# Patient Record
Sex: Female | Born: 1975 | Race: White | Hispanic: No | Marital: Married | Smoking: Never smoker
Health system: Southern US, Community
[De-identification: ages and names within clinical notes are randomized; demographics above are authoritative.]

## PROBLEM LIST (undated history)

## (undated) DIAGNOSIS — M5126 Other intervertebral disc displacement, lumbar region: Secondary | ICD-10-CM

## (undated) DIAGNOSIS — R51 Headache: Secondary | ICD-10-CM

## (undated) DIAGNOSIS — K219 Gastro-esophageal reflux disease without esophagitis: Secondary | ICD-10-CM

## (undated) DIAGNOSIS — R519 Headache, unspecified: Secondary | ICD-10-CM

---

## 1997-10-01 ENCOUNTER — Other Ambulatory Visit: Admission: RE | Admit: 1997-10-01 | Discharge: 1997-10-01 | Payer: Self-pay | Admitting: Obstetrics and Gynecology

## 1998-03-27 ENCOUNTER — Inpatient Hospital Stay (HOSPITAL_COMMUNITY): Admission: AD | Admit: 1998-03-27 | Discharge: 1998-03-29 | Payer: Self-pay | Admitting: Obstetrics and Gynecology

## 1998-05-08 ENCOUNTER — Other Ambulatory Visit: Admission: RE | Admit: 1998-05-08 | Discharge: 1998-05-08 | Payer: Self-pay | Admitting: Obstetrics and Gynecology

## 1999-09-02 ENCOUNTER — Other Ambulatory Visit: Admission: RE | Admit: 1999-09-02 | Discharge: 1999-09-02 | Payer: Self-pay | Admitting: Obstetrics and Gynecology

## 2000-01-30 ENCOUNTER — Inpatient Hospital Stay (HOSPITAL_COMMUNITY): Admission: AD | Admit: 2000-01-30 | Discharge: 2000-01-30 | Payer: Self-pay | Admitting: Obstetrics and Gynecology

## 2000-02-08 ENCOUNTER — Inpatient Hospital Stay (HOSPITAL_COMMUNITY): Admission: AD | Admit: 2000-02-08 | Discharge: 2000-02-11 | Payer: Self-pay | Admitting: Obstetrics and Gynecology

## 2000-03-14 ENCOUNTER — Other Ambulatory Visit: Admission: RE | Admit: 2000-03-14 | Discharge: 2000-03-14 | Payer: Self-pay | Admitting: Obstetrics and Gynecology

## 2000-03-31 ENCOUNTER — Ambulatory Visit (HOSPITAL_COMMUNITY): Admission: RE | Admit: 2000-03-31 | Discharge: 2000-03-31 | Payer: Self-pay | Admitting: Obstetrics and Gynecology

## 2012-01-04 ENCOUNTER — Ambulatory Visit: Payer: 59

## 2013-03-12 ENCOUNTER — Ambulatory Visit (INDEPENDENT_AMBULATORY_CARE_PROVIDER_SITE_OTHER): Payer: 59 | Admitting: Physician Assistant

## 2013-03-12 VITALS — BP 102/80 | HR 116 | Temp 98.9°F | Resp 18 | Ht 65.0 in | Wt 164.0 lb

## 2013-03-12 DIAGNOSIS — R05 Cough: Secondary | ICD-10-CM

## 2013-03-12 MED ORDER — BENZONATATE 100 MG PO CAPS: ORAL_CAPSULE | ORAL | Status: AC

## 2013-03-12 MED ORDER — HYDROCOD POLST-CHLORPHEN POLST 10-8 MG/5ML PO LQCR: 5.0000 mL | Freq: Two times a day (BID) | ORAL | Status: AC

## 2013-03-12 NOTE — Progress Notes (Signed)
   [address] [place] [address]   Phone [phone]  Subjective:    Patient ID: Maria Odom, female    DOB: Jan 19, 1976, 37 y.o.   MRN: [id number]  HPI  Pt presents to clinic with cough for about 3 days - for about the last 5 days she has felt like she is getting sick but never got any cold symptoms except for the cough - the cough is dry and comes from her chest like a tickle - she has no h/o asthma and is not a smoker.  She coughs so hard her chest wall is hurting and she is throwing up.  She has not been able to sleep over the last couple of nights because of the cough.  Daughter is sick but she has the nasal congestion with the cough.  She has not used any OTC meds.  Review of Systems  Constitutional: Positive for chills. Negative for fever.  HENT: Negative for congestion, rhinorrhea and postnasal drip.   Respiratory: Positive for cough. Negative for shortness of breath and wheezing.   Cardiovascular: Positive for chest pain (chest wall pain).  Neurological: Negative for headaches.       Objective:   Physical Exam  Vitals reviewed. Constitutional: She is oriented to person, place, and time. She appears well-developed and well-nourished.  HENT:  Head: Normocephalic and atraumatic.  Right Ear: Hearing, tympanic membrane, external ear and ear canal normal.  Left Ear: Hearing, tympanic membrane, external ear and ear canal normal.  Nose: Nose normal.  Mouth/Throat: Uvula is midline, oropharynx is clear and moist and mucous membranes are normal.  Eyes: Conjunctivae are normal.  Neck: Normal range of motion.  Cardiovascular: Normal rate, regular rhythm and normal heart sounds.   No murmur heard. Pulmonary/Chest: Effort normal and breath sounds normal. She has no wheezes.  Lymphadenopathy:    She has no cervical adenopathy.  Neurological: She is alert and oriented to person, place, and time.  Skin: Skin is warm and dry.  Psychiatric: She has a normal mood and affect. Her  behavior is normal. Judgment and thought content normal.       Assessment & Plan:  Cough - from a viral illness - with dry cough it sounds inflammatory in nature. Plan: chlorpheniramine-HYDROcodone (TUSSIONEX PENNKINETIC ER) 10-8 MG/5ML LQCR, benzonatate (TESSALON) 100 MG capsule Pt to push fluids.  Benny Lennert PA-C 03/12/2013 9:44 AM

## 2014-02-13 ENCOUNTER — Ambulatory Visit: Payer: Self-pay | Admitting: Orthopedic Surgery

## 2014-02-21 ENCOUNTER — Ambulatory Visit: Payer: Self-pay | Admitting: Orthopedic Surgery

## 2014-02-26 ENCOUNTER — Encounter (HOSPITAL_COMMUNITY): Payer: Self-pay | Admitting: Pharmacy Technician

## 2014-03-05 ENCOUNTER — Ambulatory Visit: Payer: Self-pay | Admitting: Orthopedic Surgery

## 2014-03-05 NOTE — H&P (Signed)
Maria Odom DOB: Mar 02, 1976 Married / Language: English / Race: White Female  Chief complaint: back, buttock and leg pain  History of Present Illness  The patient is a 38 year old female who presents today for follow up of their back. The patient is being followed for their low back symptoms. They are now year(s) out from when symptoms began. Symptoms reported today include: pain (bilateral buttocks L > R), leg pain (bilaterally - comes and goes) and pain with sitting. The patient states that they are doing poorly. Current treatment includes: physical therapy, activity modification and NSAIDs. The following medication has been used for pain control: ibuprofen. The patient presents today following physical therapy. The patient has not gotten any relief of their symptoms with physical therapy (3 visits to date and it has made her worse). Note for "Follow-up back": Maria Odom follows up and reports worsening symptoms of back pain and B/L buttock and leg pain, left worse than right. Since her last visit with Dr. Shelle IronBeane she has been doing PT and reports this has actually made her worse. The buttock pain is constant, left worse than right. The leg pain is intermittent, lateral thigh to the knee. The buttock and leg pain is more severe than back pain. She did have a prior ESI with about a week of partial relief of her symptoms. She is worse sitting, better standing and moving, and is comfortable lying down. She works as an Environmental health practitioneradministrative assistant and has the option to sit/stand as needed which she has been doing.  Allergies  No Known Drug Allergies06/23/2015  Family History Diabetes Mellitus father First Degree Relatives  Social History Tobacco use never smoker  Medication History Ibuprofen (Oral) Specific dose unknown - Active. Medications Reconciled  Review of Systems  General Not Present- Fever and Weight Loss. Skin Not Present- Rash. Cardiovascular Not Present- Chest Pain and Shortness  of Breath. Musculoskeletal Present- Back Pain and Muscle Pain. Not Present- Joint Swelling. Neurological Present- Numbness. Not Present- Difficulty with balance, Loss of bladder control, Loss of bowel control and Weakness. Endocrine Not Present- Excessive Thirst and Excessive Urination. Hematology Not Present- Easy Bruising.  Physical Exam General Mental Status -Alert. General Appearance-pleasant, In acute distress, appears uncomfortable(standing, pacing). Orientation-Oriented X3. Build & Nutrition-Well nourished and Well developed. Gait-Unassisted and unimpaired.  Abdomen Palpation/Percussion Tenderness - Abdomen is non-tender to palpation. Rigidity (guarding) - Abdomen is soft.  Peripheral Vascular Lower Extremity Palpation - Homan's sign - Bilateral - Negative (normal). Posterior tibial pulse - Bilateral - 2+. Dorsalis pedis pulse - Bilateral - 2+.  Neurologic Motor Strength - Hip Flexion - Bilateral - 5/5. Quadriceps - Bilateral - 5/5. Hamstrings - Bilateral - 5/5. Ankle Dorsiflexion - Bilateral - 5/5. Ankle Plantarflexion - Bilateral - 5/5. Extensor Hallucis Longus - Bilateral - 5/5. Sensation Lower Extremity - Bilateral - sensation is intact to light touch in the lower extremity. Reflexes Patellar Reflex - Bilateral - 2+. Achilles Reflex - Bilateral - 2+. Babinski - Bilateral - Babinski not present. Clonus - Bilateral - clonus not present.  Musculoskeletal Spine/Ribs/Pelvis  Lumbosacral Spine: Inspection and Palpation - Tenderness - left buttock is tender to palpation and right buttock is tender to palpation, no tenderness to palpation of the lumbar spinous processes, no tenderness to palpation of the left flank, no tenderness to palpation of right flank, no tenderness to palpation of left lumbar paraspinals, no tenderness to palpation of right lumbar paraspinals, no tenderness to palpation of the left greater trochanter, no tenderness to palpation of the right  greater trochanter. Swelling - none. Other characteristics - no ecchymosis, no abnormal warmth, no erythema, no evidence of cellulitis. ROM - Flexion - moderately decreased range of motion and painful. Extension - mildly decreased range of motion. Special Testing - Lumbar - Left seated straight leg raise positive(produces buttock and thigh pain) and Right seated straight leg raise positive(produces buttock pain). Lower Extremity  Right Lower Extremity: Right Hip - ROM - Full ROM of the hip and pain-free. Right Knee - ROM - Full ROM of the knee and pain-free. Right Ankle - ROM - Full and pain-free ROM of the ankle. Left Lower Extremity: Left Hip - ROM - Full ROM of the hip and pain-free. Left Knee - ROM - Full ROM of the knee and pain-free. Left Ankle - ROM - Full and pain-free ROM of the ankle.  Lymphatic General Lymphatics Description - No Localized lymphadenopathy.  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 No listhesis or instability.  MRI images and report reviewed by Dr. 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 We also discussed increase in pain, adjacent segment disease, recurrent disc herniation, need for future surgery including repeat decompression and/or fusion. We also discussed risks of postoperative hematoma, paralysis, anesthetic complications including DVT, PE, death, cardiopulmonary dysfunction. In addition, the perioperative and postoperative courses were discussed in detail including the rehabilitative time and return to functional activity and work. I provided the patient with an illustrated handout and utilized the appropriate surgical models. Patient is instructed to follow up after surgery.  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 Discussed again relevant anatomy, HNP on MRI, ongoing symptoms, and tx options. She did have a prior ESI with only temporary relief. PT has actually worsened her symptoms, will hold on that for now. At this point, recommend proceeding with surgery which would be microlumbar decompression L4-5. Discussed the procedure itself as well as risks, complications, and alternatives, including but not limited to DVT, PE, infx, bleeding, failure of procedure, need for secondary procedure, nerve injury, ongoing pain/symptoms, dural tear, CSF leak, and anesthesia risk, even stroke or death. Also discussed typical post-op course as well as spine precautions: no lifting, bending, twisting, prolonged sitting x 6 weeks post-op. Discussed walking program, PT, gentle stretching post-op. All questions were answered. Patient desires to proceed with surgery. She is otherwise healthy and does not require any pre-op clearance. Discussed return to work with option to sit/stand several weeks post-op if doing well. We also discussed possible chance of future recurrent disc rupture or progressive DDD both of which could potentially require fusion if refractory to conservative tx. She denies any hx of MRSA or DVT. Will proceed accordingly to schedule and she will follow up 10-14 days post-op for suture removal. She will call with any questions or concerns in the interim.  Plan  microlumbar decompression L4-5   Signed electronically by Andrez Grime PA-C for Dr. Shelle Iron

## 2014-03-06 NOTE — Patient Instructions (Addendum)
Maria Odom  03/06/2014                           YOUR PROCEDURE IS SCHEDULED ON:  03/12/14               ENTER THRU [facility] MAIN [facility]OSPITAL ENTRANCE AND                            FOLLOW  SIGNS TO SHORT STAY CENTER                 ARRIVE AT SHORT STAY AT:  9:30 AM               CALL THIS NUMBER IF ANY PROBLEMS THE DAY OF SURGERY :               [phone]                                REMEMBER:   Do not eat food or drink liquids AFTER MIDNIGHT                  Take these medicines the morning of surgery with               A SIPS OF WATER :   PRILOSEC      Do not wear jewelry, make-up   Do not wear lotions, powders, or perfumes.   Do not shave legs or underarms 12 hrs. before surgery (men may shave face)  Do not bring valuables to the hospital.  Contacts, dentures or bridgework may not be worn into surgery.  Leave suitcase in the car. After surgery it may be brought to your room.  For patients admitted to the hospital more than one night, checkout time is            11:00 AM                                                       The day of discharge.   Patients discharged the day of surgery will not be allowed to drive home.            If going home same day of surgery, must have someone stay with you              FIRST 24 hrs at home and arrange for some one to drive you              home from hospital.   ________________________________________________________________________                                                                                                  [facility] - PREPARING FOR SURGERY  Before surgery, you can play an important role.  Because  skin is not sterile, your skin needs to be as free of germs as possible.  You can reduce the number of germs on your skin by washing with CHG (chlorahexidine gluconate) soap before surgery.  CHG is an antiseptic cleaner which kills germs and bonds with the skin to continue killing germs even  after washing. Please DO NOT use if you have an allergy to CHG or antibacterial soaps.  If your skin becomes reddened/irritated stop using the CHG and inform your nurse when you arrive at Short Stay. Do not shave (including legs and underarms) for at least 48 hours prior to the first CHG shower.  You may shave your face. Please follow these instructions carefully:   1.  Shower with CHG Soap the night before surgery and the  morning of Surgery.   2.  If you choose to wash your hair, wash your hair first as usual with your  normal  Shampoo.   3.  After you shampoo, rinse your hair and body thoroughly to remove the  shampoo.                                         4.  Use CHG as you would any other liquid soap.  You can apply chg directly  to the skin and wash . Gently wash with scrungie or clean wascloth    5.  Apply the CHG Soap to your body ONLY FROM THE NECK DOWN.   Do not use on open                           Wound or open sores. Avoid contact with eyes, ears mouth and genitals (private parts).                        Genitals (private parts) with your normal soap.              6.  Wash thoroughly, paying special attention to the area where your surgery  will be performed.   7.  Thoroughly rinse your body with warm water from the neck down.   8.  DO NOT shower/wash with your normal soap after using and rinsing off  the CHG Soap .                9.  Pat yourself dry with a clean towel.             10.  Wear clean pajamas.             11.  Place clean sheets on your bed the night of your first shower and do not  sleep with pets.  Day of Surgery : Do not apply any lotions/deodorants the morning of surgery.  Please wear clean clothes to the hospital/surgery center.  FAILURE TO FOLLOW THESE INSTRUCTIONS MAY RESULT IN THE CANCELLATION OF YOUR SURGERY    PATIENT  SIGNATURE_________________________________  ______________________________________________________________________     Maria Odom  An incentive spirometer is a tool that can help keep your lungs clear and active. This tool measures how well you are filling your lungs with each breath. Taking long deep breaths may help reverse or decrease the chance of developing breathing (pulmonary) problems (especially infection) following:  A long period of time when you are unable to move or be active. BEFORE THE  PROCEDURE   If the spirometer includes an indicator to show your best effort, your nurse or respiratory therapist will set it to a desired goal.  If possible, sit up straight or lean slightly forward. Try not to slouch.  Hold the incentive spirometer in an upright position. INSTRUCTIONS FOR USE  1. Sit on the edge of your bed if possible, or sit up as far as you can in bed or on a chair. 2. Hold the incentive spirometer in an upright position. 3. Breathe out normally. 4. Place the mouthpiece in your mouth and seal your lips tightly around it. 5. Breathe in slowly and as deeply as possible, raising the piston or the ball toward the top of the column. 6. Hold your breath for 3-5 seconds or for as long as possible. Allow the piston or ball to fall to the bottom of the column. 7. Remove the mouthpiece from your mouth and breathe out normally. 8. Rest for a few seconds and repeat Steps 1 through 7 at least 10 times every 1-2 hours when you are awake. Take your time and take a few normal breaths between deep breaths. 9. The spirometer may include an indicator to show your best effort. Use the indicator as a goal to work toward during each repetition. 10. After each set of 10 deep breaths, practice coughing to be sure your lungs are clear. If you have an incision (the cut made at the time of surgery), support your incision when coughing by placing a pillow or rolled up towels firmly  against it. Once you are able to get out of bed, walk around indoors and cough well. You may stop using the incentive spirometer when instructed by your caregiver.  RISKS AND COMPLICATIONS  Take your time so you do not get dizzy or light-headed.  If you are in pain, you may need to take or ask for pain medication before doing incentive spirometry. It is harder to take a deep breath if you are having pain. AFTER USE  Rest and breathe slowly and easily.  It can be helpful to keep track of a log of your progress. Your caregiver can provide you with a simple table to help with this. If you are using the spirometer at home, follow these instructions: SEEK MEDICAL CARE IF:   You are having difficultly using the spirometer.  You have trouble using the spirometer as often as instructed.  Your pain medication is not giving enough relief while using the spirometer.  You develop fever of 100.5 F (38.1 C) or higher. SEEK IMMEDIATE MEDICAL CARE IF:   You cough up bloody sputum that had not been present before.  You develop fever of 102 F (38.9 C) or greater.  You develop worsening pain at or near the incision site. MAKE SURE YOU:   Understand these instructions.  Will watch your condition.  Will get help right away if you are not doing well or get worse. Document Released: 10/03/2006 Document Revised: 08/15/2011 Document Reviewed: 12/04/2006 [facility] Patient Information 2014 [place], [place].   ________________________________________________________________________

## 2014-03-07 ENCOUNTER — Encounter (HOSPITAL_COMMUNITY)
Admission: RE | Admit: 2014-03-07 | Discharge: 2014-03-07 | Disposition: A | Discharge status: Home / Self Care | Payer: 59 | Source: Ambulatory Visit | Attending: Specialist | Admitting: Specialist

## 2014-03-07 ENCOUNTER — Encounter (INDEPENDENT_AMBULATORY_CARE_PROVIDER_SITE_OTHER): Payer: Self-pay

## 2014-03-07 ENCOUNTER — Encounter (HOSPITAL_COMMUNITY): Payer: Self-pay

## 2014-03-07 ENCOUNTER — Ambulatory Visit (HOSPITAL_COMMUNITY)
Admission: RE | Admit: 2014-03-07 | Discharge: 2014-03-07 | Disposition: A | Discharge status: Home / Self Care | Payer: 59 | Source: Ambulatory Visit | Attending: Orthopedic Surgery | Admitting: Orthopedic Surgery

## 2014-03-07 DIAGNOSIS — M519 Unspecified thoracic, thoracolumbar and lumbosacral intervertebral disc disorder: Secondary | ICD-10-CM | POA: Diagnosis not present

## 2014-03-07 DIAGNOSIS — Z01818 Encounter for other preprocedural examination: Secondary | ICD-10-CM | POA: Diagnosis present

## 2014-03-07 HISTORY — DX: Headache, unspecified: R51.9

## 2014-03-07 HISTORY — DX: Headache: R51

## 2014-03-07 HISTORY — DX: Other intervertebral disc displacement, lumbar region: M51.26

## 2014-03-07 HISTORY — DX: Gastro-esophageal reflux disease without esophagitis: K21.9

## 2014-03-07 LAB — CBC
HCT: 42.6 % (ref 36.0–46.0)
HEMOGLOBIN: 14.4 g/dL (ref 12.0–15.0)
MCH: 31.8 pg (ref 26.0–34.0)
MCHC: 33.8 g/dL (ref 30.0–36.0)
MCV: 94 fL (ref 78.0–100.0)
Platelets: 296 10*3/uL (ref 150–400)
RBC: 4.53 MIL/uL (ref 3.87–5.11)
RDW: 12.4 % (ref 11.5–15.5)
WBC: 6.3 10*3/uL (ref 4.0–10.5)

## 2014-03-07 LAB — BASIC METABOLIC PANEL
ANION GAP: 13 (ref 5–15)
BUN: 10 mg/dL (ref 6–23)
CHLORIDE: 100 meq/L (ref 96–112)
CO2: 24 mEq/L (ref 19–32)
CREATININE: 0.79 mg/dL (ref 0.50–1.10)
Calcium: 9.6 mg/dL (ref 8.4–10.5)
GFR calc non Af Amer: >90 mL/min (ref >90)
Glucose, Bld: 93 mg/dL (ref 70–99)
Potassium: 4.3 mEq/L (ref 3.7–5.3)
SODIUM: 137 meq/L (ref 137–147)

## 2014-03-07 LAB — SURGICAL PCR SCREEN
MRSA, PCR: NEGATIVE
Staphylococcus aureus: NEGATIVE

## 2014-03-07 LAB — HCG, SERUM, QUALITATIVE: PREG SERUM: NEGATIVE

## 2014-03-11 NOTE — Anesthesia Preprocedure Evaluation (Addendum)
Anesthesia Evaluation  Patient identified by MRN, date of birth, ID band Patient awake    Reviewed: Allergy & Precautions, H&P , NPO status , Patient's Chart, lab work & pertinent test results  History of Anesthesia Complications Negative for: history of anesthetic complications  Airway Mallampati: II TM Distance: >3 FB Neck ROM: Full    Dental no notable dental hx. (+) Teeth Intact, Dental Advisory Given   Pulmonary neg pulmonary ROS,  breath sounds clear to auscultation  Pulmonary exam normal       Cardiovascular Exercise Tolerance: Good negative cardio ROS  Rhythm:Regular Rate:Normal     Neuro/Psych  Headaches, negative psych ROS   GI/Hepatic Neg liver ROS, GERD-  Medicated and Controlled,  Endo/Other  negative endocrine ROS  Renal/GU negative Renal ROS  negative genitourinary   Musculoskeletal negative musculoskeletal ROS (+)   Abdominal   Peds negative pediatric ROS (+)  Hematology negative hematology ROS (+)   Anesthesia Other Findings   Reproductive/Obstetrics negative OB ROS                          Anesthesia Physical Anesthesia Plan  ASA: II  Anesthesia Plan: General   Post-op Pain Management:    Induction: Intravenous  Airway Management Planned: Oral ETT  Additional Equipment:   Intra-op Plan:   Post-operative Plan: Extubation in OR  Informed Consent: I have reviewed the patients History and Physical, chart, labs and discussed the procedure including the risks, benefits and alternatives for the proposed anesthesia with the patient or authorized representative who has indicated his/her understanding and acceptance.   Dental advisory given  Plan Discussed with: CRNA  Anesthesia Plan Comments:         Anesthesia Quick Evaluation

## 2014-03-12 ENCOUNTER — Ambulatory Visit (HOSPITAL_COMMUNITY): Payer: 59

## 2014-03-12 ENCOUNTER — Encounter (HOSPITAL_COMMUNITY): Payer: 59 | Admitting: Anesthesiology

## 2014-03-12 ENCOUNTER — Encounter (HOSPITAL_COMMUNITY)
Admission: RE | Disposition: A | Discharge status: Home / Self Care | Payer: Self-pay | Source: Ambulatory Visit | Attending: Specialist

## 2014-03-12 ENCOUNTER — Encounter (HOSPITAL_COMMUNITY): Payer: Self-pay | Admitting: *Deleted

## 2014-03-12 ENCOUNTER — Ambulatory Visit (HOSPITAL_COMMUNITY)
Admission: RE | Admit: 2014-03-12 | Discharge: 2014-03-13 | Disposition: A | Discharge status: Home / Self Care | Payer: 59 | Source: Ambulatory Visit | Attending: Specialist | Admitting: Specialist

## 2014-03-12 ENCOUNTER — Ambulatory Visit (HOSPITAL_COMMUNITY): Payer: 59 | Admitting: Anesthesiology

## 2014-03-12 DIAGNOSIS — M545 Low back pain, unspecified: Secondary | ICD-10-CM

## 2014-03-12 DIAGNOSIS — Z79899 Other long term (current) drug therapy: Secondary | ICD-10-CM | POA: Diagnosis not present

## 2014-03-12 DIAGNOSIS — M5126 Other intervertebral disc displacement, lumbar region: Secondary | ICD-10-CM

## 2014-03-12 DIAGNOSIS — K219 Gastro-esophageal reflux disease without esophagitis: Secondary | ICD-10-CM | POA: Insufficient documentation

## 2014-03-12 DIAGNOSIS — Z7982 Long term (current) use of aspirin: Secondary | ICD-10-CM | POA: Diagnosis not present

## 2014-03-12 HISTORY — PX: LUMBAR LAMINECTOMY/DECOMPRESSION MICRODISCECTOMY: SHX5026

## 2014-03-12 SURGERY — LUMBAR LAMINECTOMY/DECOMPRESSION MICRODISCECTOMY 1 LEVEL
Anesthesia: General | Site: Back

## 2014-03-12 MED ORDER — PHENOL 1.4 % MT LIQD: 1.0000 | OROMUCOSAL | Status: DC | PRN

## 2014-03-12 MED ORDER — BUPIVACAINE-EPINEPHRINE (PF) 0.5% -1:200000 IJ SOLN
INTRAMUSCULAR | Status: AC
  Filled 2014-03-12: qty 30

## 2014-03-12 MED ORDER — OMEPRAZOLE MAGNESIUM 20 MG PO TBEC: 20.0000 mg | DELAYED_RELEASE_TABLET | ORAL | Status: DC | PRN

## 2014-03-12 MED ORDER — CEFAZOLIN SODIUM-DEXTROSE 2-3 GM-% IV SOLR
2.0000 g | Freq: Three times a day (TID) | INTRAVENOUS | Status: AC
  Administered 2014-03-12 – 2014-03-13 (×3): 2 g via INTRAVENOUS
  Filled 2014-03-12 (×3): qty 50

## 2014-03-12 MED ORDER — SODIUM CHLORIDE 0.9 % IJ SOLN: 3.0000 mL | INTRAMUSCULAR | Status: DC | PRN

## 2014-03-12 MED ORDER — MIDAZOLAM HCL 2 MG/2ML IJ SOLN
INTRAMUSCULAR | Status: AC
Start: 2014-03-12 — End: 2014-03-12
  Filled 2014-03-12: qty 2

## 2014-03-12 MED ORDER — OXYCODONE-ACETAMINOPHEN 5-325 MG PO TABS
1.0000 | ORAL_TABLET | ORAL | Status: DC | PRN
  Administered 2014-03-12: 1 via ORAL
  Administered 2014-03-13 (×4): 2 via ORAL
  Filled 2014-03-12 (×2): qty 2
  Filled 2014-03-12: qty 1
  Filled 2014-03-12 (×2): qty 2

## 2014-03-12 MED ORDER — ACETAMINOPHEN 650 MG RE SUPP: 650.0000 mg | RECTAL | Status: DC | PRN

## 2014-03-12 MED ORDER — FENTANYL CITRATE 0.05 MG/ML IJ SOLN
INTRAMUSCULAR | Status: DC | PRN
  Administered 2014-03-12: 100 ug via INTRAVENOUS
  Administered 2014-03-12 (×3): 50 ug via INTRAVENOUS

## 2014-03-12 MED ORDER — MEPERIDINE HCL 50 MG/ML IJ SOLN: 6.2500 mg | INTRAMUSCULAR | Status: DC | PRN

## 2014-03-12 MED ORDER — HYDROMORPHONE HCL 2 MG/ML IJ SOLN
INTRAMUSCULAR | Status: AC
  Filled 2014-03-12: qty 1

## 2014-03-12 MED ORDER — KCL IN DEXTROSE-NACL 20-5-0.45 MEQ/L-%-% IV SOLN
INTRAVENOUS | Status: AC
  Filled 2014-03-12 (×2): qty 1000

## 2014-03-12 MED ORDER — MIDAZOLAM HCL 5 MG/5ML IJ SOLN
INTRAMUSCULAR | Status: DC | PRN
  Administered 2014-03-12: 2 mg via INTRAVENOUS

## 2014-03-12 MED ORDER — PROPOFOL 10 MG/ML IV BOLUS
INTRAVENOUS | Status: AC
  Filled 2014-03-12: qty 20

## 2014-03-12 MED ORDER — BISACODYL 5 MG PO TBEC: 5.0000 mg | DELAYED_RELEASE_TABLET | Freq: Every day | ORAL | Status: DC | PRN

## 2014-03-12 MED ORDER — ROCURONIUM BROMIDE 100 MG/10ML IV SOLN
INTRAVENOUS | Status: DC | PRN
  Administered 2014-03-12 (×3): 5 mg via INTRAVENOUS
  Administered 2014-03-12: 35 mg via INTRAVENOUS

## 2014-03-12 MED ORDER — BUPIVACAINE-EPINEPHRINE 0.5% -1:200000 IJ SOLN
INTRAMUSCULAR | Status: DC | PRN
  Administered 2014-03-12: 10 mL

## 2014-03-12 MED ORDER — PROMETHAZINE HCL 25 MG/ML IJ SOLN
6.2500 mg | INTRAMUSCULAR | Status: DC | PRN
Start: 2014-03-12 — End: 2014-03-12

## 2014-03-12 MED ORDER — CEFAZOLIN SODIUM-DEXTROSE 2-3 GM-% IV SOLR
INTRAVENOUS | Status: AC
  Filled 2014-03-12: qty 50

## 2014-03-12 MED ORDER — NEOSTIGMINE METHYLSULFATE 10 MG/10ML IV SOLN
INTRAVENOUS | Status: DC | PRN
  Administered 2014-03-12: 4 mg via INTRAVENOUS

## 2014-03-12 MED ORDER — HYDROMORPHONE HCL 1 MG/ML IJ SOLN
INTRAMUSCULAR | Status: DC | PRN
  Administered 2014-03-12: 0.5 mg via INTRAVENOUS

## 2014-03-12 MED ORDER — DOCUSATE SODIUM 100 MG PO CAPS: 100.0000 mg | ORAL_CAPSULE | Freq: Two times a day (BID) | ORAL | Status: AC | PRN

## 2014-03-12 MED ORDER — THROMBIN 5000 UNITS EX SOLR
CUTANEOUS | Status: DC | PRN
  Administered 2014-03-12: 5000 [IU] via TOPICAL

## 2014-03-12 MED ORDER — ONDANSETRON HCL 4 MG/2ML IJ SOLN: 4.0000 mg | INTRAMUSCULAR | Status: DC | PRN

## 2014-03-12 MED ORDER — FENTANYL CITRATE 0.05 MG/ML IJ SOLN
INTRAMUSCULAR | Status: AC
  Filled 2014-03-12: qty 5

## 2014-03-12 MED ORDER — METHOCARBAMOL 500 MG PO TABS: 500.0000 mg | ORAL_TABLET | Freq: Three times a day (TID) | ORAL | Status: AC | PRN

## 2014-03-12 MED ORDER — HYDROMORPHONE HCL 1 MG/ML IJ SOLN: 0.2500 mg | INTRAMUSCULAR | Status: DC | PRN

## 2014-03-12 MED ORDER — ONDANSETRON HCL 4 MG/2ML IJ SOLN
INTRAMUSCULAR | Status: DC | PRN
  Administered 2014-03-12: 4 mg via INTRAVENOUS

## 2014-03-12 MED ORDER — SODIUM CHLORIDE 0.9 % IR SOLN
Status: DC | PRN
  Administered 2014-03-12: 12:00:00

## 2014-03-12 MED ORDER — MENTHOL 3 MG MT LOZG: 1.0000 | LOZENGE | OROMUCOSAL | Status: DC | PRN

## 2014-03-12 MED ORDER — ACETAMINOPHEN 325 MG PO TABS: 650.0000 mg | ORAL_TABLET | ORAL | Status: DC | PRN

## 2014-03-12 MED ORDER — DEXAMETHASONE SODIUM PHOSPHATE 10 MG/ML IJ SOLN
INTRAMUSCULAR | Status: DC | PRN
  Administered 2014-03-12: 10 mg via INTRAVENOUS

## 2014-03-12 MED ORDER — THROMBIN 5000 UNITS EX SOLR
OROMUCOSAL | Status: DC | PRN
  Administered 2014-03-12: 12:00:00 via TOPICAL

## 2014-03-12 MED ORDER — SODIUM CHLORIDE 0.9 % IJ SOLN
3.0000 mL | Freq: Two times a day (BID) | INTRAMUSCULAR | Status: DC
  Administered 2014-03-12: 3 mL via INTRAVENOUS

## 2014-03-12 MED ORDER — THROMBIN 5000 UNITS EX SOLR
CUTANEOUS | Status: AC
  Filled 2014-03-12: qty 10000

## 2014-03-12 MED ORDER — METHOCARBAMOL 1000 MG/10ML IJ SOLN
500.0000 mg | Freq: Four times a day (QID) | INTRAVENOUS | Status: DC | PRN
  Administered 2014-03-12: 500 mg via INTRAVENOUS
  Filled 2014-03-12: qty 5

## 2014-03-12 MED ORDER — LACTATED RINGERS IV SOLN
INTRAVENOUS | Status: DC
  Administered 2014-03-12: 13:00:00 via INTRAVENOUS
  Administered 2014-03-12: 1000 mL via INTRAVENOUS

## 2014-03-12 MED ORDER — LIDOCAINE HCL (CARDIAC) 20 MG/ML IV SOLN
INTRAVENOUS | Status: DC | PRN
  Administered 2014-03-12: 50 mg via INTRAVENOUS

## 2014-03-12 MED ORDER — SODIUM CHLORIDE 0.9 % IV SOLN: 250.0000 mL | INTRAVENOUS | Status: DC

## 2014-03-12 MED ORDER — GLYCOPYRROLATE 0.2 MG/ML IJ SOLN
INTRAMUSCULAR | Status: DC | PRN
  Administered 2014-03-12: .6 mg via INTRAVENOUS

## 2014-03-12 MED ORDER — HYDROMORPHONE HCL 1 MG/ML IJ SOLN
0.5000 mg | INTRAMUSCULAR | Status: DC | PRN
  Administered 2014-03-12: 1 mg via INTRAVENOUS
  Administered 2014-03-12: 0.5 mg via INTRAVENOUS
  Filled 2014-03-12 (×3): qty 1

## 2014-03-12 MED ORDER — PROPOFOL 10 MG/ML IV BOLUS
INTRAVENOUS | Status: DC | PRN
  Administered 2014-03-12: 150 mg via INTRAVENOUS

## 2014-03-12 MED ORDER — METHOCARBAMOL 500 MG PO TABS
500.0000 mg | ORAL_TABLET | Freq: Four times a day (QID) | ORAL | Status: DC | PRN
  Administered 2014-03-13: 500 mg via ORAL
  Filled 2014-03-12: qty 1

## 2014-03-12 MED ORDER — DOCUSATE SODIUM 100 MG PO CAPS
100.0000 mg | ORAL_CAPSULE | Freq: Two times a day (BID) | ORAL | Status: DC
  Administered 2014-03-12 – 2014-03-13 (×2): 100 mg via ORAL

## 2014-03-12 MED ORDER — SENNOSIDES-DOCUSATE SODIUM 8.6-50 MG PO TABS
1.0000 | ORAL_TABLET | Freq: Every evening | ORAL | Status: DC | PRN
Start: 2014-03-12 — End: 2014-03-13

## 2014-03-12 MED ORDER — HYDROCODONE-ACETAMINOPHEN 5-325 MG PO TABS: 1.0000 | ORAL_TABLET | ORAL | Status: DC | PRN

## 2014-03-12 MED ORDER — CEFAZOLIN SODIUM-DEXTROSE 2-3 GM-% IV SOLR
2.0000 g | INTRAVENOUS | Status: AC
  Administered 2014-03-12: 2 g via INTRAVENOUS

## 2014-03-12 MED ORDER — OXYCODONE-ACETAMINOPHEN 5-325 MG PO TABS: 1.0000 | ORAL_TABLET | ORAL | Status: AC | PRN

## 2014-03-12 MED ORDER — PANTOPRAZOLE SODIUM 40 MG PO TBEC
40.0000 mg | DELAYED_RELEASE_TABLET | Freq: Every day | ORAL | Status: DC
  Administered 2014-03-13: 40 mg via ORAL
  Filled 2014-03-12: qty 1

## 2014-03-12 MED ORDER — MAGNESIUM CITRATE PO SOLN: 1.0000 | Freq: Once | ORAL | Status: AC | PRN

## 2014-03-12 MED ORDER — SODIUM CHLORIDE 0.9 % IR SOLN
Status: AC
  Filled 2014-03-12: qty 1

## 2014-03-12 MED ORDER — ALUM & MAG HYDROXIDE-SIMETH 200-200-20 MG/5ML PO SUSP
30.0000 mL | Freq: Four times a day (QID) | ORAL | Status: DC | PRN
  Administered 2014-03-13: 30 mL via ORAL
  Filled 2014-03-12: qty 30

## 2014-03-12 SURGICAL SUPPLY — 46 items
BAG SPEC THK2 15X12 ZIP CLS (MISCELLANEOUS)
BAG ZIPLOCK 12X15 (MISCELLANEOUS) IMPLANT
CLEANER TIP ELECTROSURG 2X2 (MISCELLANEOUS) ×3 IMPLANT
CLOSURE WOUND 1/2 X4 (GAUZE/BANDAGES/DRESSINGS) ×1
CLOTH 2% CHLOROHEXIDINE 3PK (PERSONAL CARE ITEMS) ×3 IMPLANT
DRAPE MICROSCOPE LEICA (MISCELLANEOUS) ×3 IMPLANT
DRAPE POUCH INSTRU U-SHP 10X18 (DRAPES) ×3 IMPLANT
DRAPE SURG 17X11 SM STRL (DRAPES) ×3 IMPLANT
DRAPE UTILITY XL STRL (DRAPES) ×3 IMPLANT
DRSG AQUACEL AG ADV 3.5X 4 (GAUZE/BANDAGES/DRESSINGS) ×2 IMPLANT
DRSG AQUACEL AG ADV 3.5X 6 (GAUZE/BANDAGES/DRESSINGS) IMPLANT
DURAPREP 26ML APPLICATOR (WOUND CARE) ×3 IMPLANT
DURASEAL SPINE SEALANT 3ML (MISCELLANEOUS) IMPLANT
ELECT BLADE TIP CTD 4 INCH (ELECTRODE) IMPLANT
ELECT REM PT RETURN 9FT ADLT (ELECTROSURGICAL) ×3
ELECTRODE REM PT RTRN 9FT ADLT (ELECTROSURGICAL) ×1 IMPLANT
GLOVE BIOGEL PI IND STRL 7.5 (GLOVE) ×1 IMPLANT
GLOVE BIOGEL PI INDICATOR 7.5 (GLOVE) ×2
GLOVE SURG SS PI 7.5 STRL IVOR (GLOVE) ×3 IMPLANT
GLOVE SURG SS PI 8.0 STRL IVOR (GLOVE) ×6 IMPLANT
GOWN STRL REUS W/TWL XL LVL3 (GOWN DISPOSABLE) ×6 IMPLANT
IV CATH 14GX2 1/4 (CATHETERS) IMPLANT
KIT BASIN OR (CUSTOM PROCEDURE TRAY) ×3 IMPLANT
KIT POSITIONING SURG ANDREWS (MISCELLANEOUS) ×3 IMPLANT
MANIFOLD NEPTUNE II (INSTRUMENTS) ×3 IMPLANT
NDL SPNL 18GX3.5 QUINCKE PK (NEEDLE) ×2 IMPLANT
NEEDLE SPNL 18GX3.5 QUINCKE PK (NEEDLE) ×6 IMPLANT
PACK LAMINECTOMY ORTHO (CUSTOM PROCEDURE TRAY) ×3 IMPLANT
PATTIES SURGICAL .5 X.5 (GAUZE/BANDAGES/DRESSINGS) IMPLANT
PATTIES SURGICAL .75X.75 (GAUZE/BANDAGES/DRESSINGS) IMPLANT
PATTIES SURGICAL 1X1 (DISPOSABLE) IMPLANT
SPONGE SURGIFOAM ABS GEL 100 (HEMOSTASIS) ×3 IMPLANT
STAPLER VISISTAT (STAPLE) IMPLANT
STRIP CLOSURE SKIN 1/2X4 (GAUZE/BANDAGES/DRESSINGS) ×2 IMPLANT
SUT NURALON 4 0 TR CR/8 (SUTURE) IMPLANT
SUT PROLENE 3 0 PS 2 (SUTURE) ×3 IMPLANT
SUT VIC AB 1 CT1 27 (SUTURE)
SUT VIC AB 1 CT1 27XBRD ANTBC (SUTURE) IMPLANT
SUT VIC AB 1-0 CT2 27 (SUTURE) ×3 IMPLANT
SUT VIC AB 2-0 CT1 27 (SUTURE)
SUT VIC AB 2-0 CT1 TAPERPNT 27 (SUTURE) IMPLANT
SUT VIC AB 2-0 CT2 27 (SUTURE) ×3 IMPLANT
SYR 3ML LL SCALE MARK (SYRINGE) IMPLANT
TOWEL OR 17X26 10 PK STRL BLUE (TOWEL DISPOSABLE) ×3 IMPLANT
TOWEL OR NON WOVEN STRL DISP B (DISPOSABLE) IMPLANT
YANKAUER SUCT BULB TIP NO VENT (SUCTIONS) IMPLANT

## 2014-03-12 NOTE — Transfer of Care (Signed)
Immediate Anesthesia Transfer of Care Note  Patient: Maria ReefBrandi J Odom  Procedure(s) Performed: Procedure(s) (LRB): MICRO LUMBAR DECOMPRESSION L4 - L5 1 LEVEL (N/A)  Patient Location: PACU  Anesthesia Type: General  Level of Consciousness: sedated, patient cooperative and responds to stimulation  Airway & Oxygen Therapy: Patient Spontanous Breathing and Patient connected to face mask oxgen  Post-op Assessment: Report given to PACU RN and Post -op Vital signs reviewed and stable  Post vital signs: Reviewed and stable  Complications: No apparent anesthesia complications

## 2014-03-12 NOTE — Anesthesia Postprocedure Evaluation (Signed)
  Anesthesia Post-op Note  Patient: Maria Odom  Procedure(s) Performed: Procedure(s) (LRB): MICRO LUMBAR DECOMPRESSION L4 - L5 1 LEVEL (N/A)  Patient Location: PACU  Anesthesia Type: General  Level of Consciousness: awake and alert   Airway and Oxygen Therapy: Patient Spontanous Breathing  Post-op Pain: mild  Post-op Assessment: Post-op Vital signs reviewed, Patient's Cardiovascular Status Stable, Respiratory Function Stable, Patent Airway and No signs of Nausea or vomiting  Last Vitals:  Filed Vitals:   03/12/14 1345  BP: 119/72  Pulse: 89  Temp:   Resp: 14    Post-op Vital Signs: stable   Complications: No apparent anesthesia complications

## 2014-03-12 NOTE — H&P (View-Only) (Signed)
Maria Odom DOB: Mar 02, 1976 Married / Language: English / Race: White Female  Chief complaint: back, buttock and leg pain  History of Present Illness  The patient is a 38 year old female who presents today for follow up of their back. The patient is being followed for their low back symptoms. They are now year(s) out from when symptoms began. Symptoms reported today include: pain (bilateral buttocks L > R), leg pain (bilaterally - comes and goes) and pain with sitting. The patient states that they are doing poorly. Current treatment includes: physical therapy, activity modification and NSAIDs. The following medication has been used for pain control: ibuprofen. The patient presents today following physical therapy. The patient has not gotten any relief of their symptoms with physical therapy (3 visits to date and it has made her worse). Note for "Follow-up back": Maria Odom follows up and reports worsening symptoms of back pain and B/L buttock and leg pain, left worse than right. Since her last visit with Dr. Shelle IronBeane she has been doing PT and reports this has actually made her worse. The buttock pain is constant, left worse than right. The leg pain is intermittent, lateral thigh to the knee. The buttock and leg pain is more severe than back pain. She did have a prior ESI with about a week of partial relief of her symptoms. She is worse sitting, better standing and moving, and is comfortable lying down. She works as an Environmental health practitioneradministrative assistant and has the option to sit/stand as needed which she has been doing.  Allergies  No Known Drug Allergies06/23/2015  Family History Diabetes Mellitus father First Degree Relatives  Social History Tobacco use never smoker  Medication History Ibuprofen (Oral) Specific dose unknown - Active. Medications Reconciled  Review of Systems  General Not Present- Fever and Weight Loss. Skin Not Present- Rash. Cardiovascular Not Present- Chest Pain and Shortness  of Breath. Musculoskeletal Present- Back Pain and Muscle Pain. Not Present- Joint Swelling. Neurological Present- Numbness. Not Present- Difficulty with balance, Loss of bladder control, Loss of bowel control and Weakness. Endocrine Not Present- Excessive Thirst and Excessive Urination. Hematology Not Present- Easy Bruising.  Physical Exam General Mental Status -Alert. General Appearance-pleasant, In acute distress, appears uncomfortable(standing, pacing). Orientation-Oriented X3. Build & Nutrition-Well nourished and Well developed. Gait-Unassisted and unimpaired.  Abdomen Palpation/Percussion Tenderness - Abdomen is non-tender to palpation. Rigidity (guarding) - Abdomen is soft.  Peripheral Vascular Lower Extremity Palpation - Homan's sign - Bilateral - Negative (normal). Posterior tibial pulse - Bilateral - 2+. Dorsalis pedis pulse - Bilateral - 2+.  Neurologic Motor Strength - Hip Flexion - Bilateral - 5/5. Quadriceps - Bilateral - 5/5. Hamstrings - Bilateral - 5/5. Ankle Dorsiflexion - Bilateral - 5/5. Ankle Plantarflexion - Bilateral - 5/5. Extensor Hallucis Longus - Bilateral - 5/5. Sensation Lower Extremity - Bilateral - sensation is intact to light touch in the lower extremity. Reflexes Patellar Reflex - Bilateral - 2+. Achilles Reflex - Bilateral - 2+. Babinski - Bilateral - Babinski not present. Clonus - Bilateral - clonus not present.  Musculoskeletal Spine/Ribs/Pelvis  Lumbosacral Spine: Inspection and Palpation - Tenderness - left buttock is tender to palpation and right buttock is tender to palpation, no tenderness to palpation of the lumbar spinous processes, no tenderness to palpation of the left flank, no tenderness to palpation of right flank, no tenderness to palpation of left lumbar paraspinals, no tenderness to palpation of right lumbar paraspinals, no tenderness to palpation of the left greater trochanter, no tenderness to palpation of the right  greater trochanter. Swelling - none. Other characteristics - no ecchymosis, no abnormal warmth, no erythema, no evidence of cellulitis. ROM - Flexion - moderately decreased range of motion and painful. Extension - mildly decreased range of motion. Special Testing - Lumbar - Left seated straight leg raise positive(produces buttock and thigh pain) and Right seated straight leg raise positive(produces buttock pain). Lower Extremity  Right Lower Extremity: Right Hip - ROM - Full ROM of the hip and pain-free. Right Knee - ROM - Full ROM of the knee and pain-free. Right Ankle - ROM - Full and pain-free ROM of the ankle. Left Lower Extremity: Left Hip - ROM - Full ROM of the hip and pain-free. Left Knee - ROM - Full ROM of the knee and pain-free. Left Ankle - ROM - Full and pain-free ROM of the ankle.  Lymphatic General Lymphatics Description - No Localized lymphadenopathy.  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 No listhesis or instability.  MRI images and report reviewed by Dr. 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 We also discussed increase in pain, adjacent segment disease, recurrent disc herniation, need for future surgery including repeat decompression and/or fusion. We also discussed risks of postoperative hematoma, paralysis, anesthetic complications including DVT, PE, death, cardiopulmonary dysfunction. In addition, the perioperative and postoperative courses were discussed in detail including the rehabilitative time and return to functional activity and work. I provided the patient with an illustrated handout and utilized the appropriate surgical models. Patient is instructed to follow up after surgery.  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 Discussed again relevant anatomy, HNP on MRI, ongoing symptoms, and tx options. She did have a prior ESI with only temporary relief. PT has actually worsened her symptoms, will hold on that for now. At this point, recommend proceeding with surgery which would be microlumbar decompression L4-5. Discussed the procedure itself as well as risks, complications, and alternatives, including but not limited to DVT, PE, infx, bleeding, failure of procedure, need for secondary procedure, nerve injury, ongoing pain/symptoms, dural tear, CSF leak, and anesthesia risk, even stroke or death. Also discussed typical post-op course as well as spine precautions: no lifting, bending, twisting, prolonged sitting x 6 weeks post-op. Discussed walking program, PT, gentle stretching post-op. All questions were answered. Patient desires to proceed with surgery. She is otherwise healthy and does not require any pre-op clearance. Discussed return to work with option to sit/stand several weeks post-op if doing well. We also discussed possible chance of future recurrent disc rupture or progressive DDD both of which could potentially require fusion if refractory to conservative tx. She denies any hx of MRSA or DVT. Will proceed accordingly to schedule and she will follow up 10-14 days post-op for suture removal. She will call with any questions or concerns in the interim.  Plan  microlumbar decompression L4-5   Signed electronically by Andrez Grime PA-C for Dr. Shelle Odom

## 2014-03-12 NOTE — Brief Op Note (Signed)
03/12/2014  1:18 PM  PATIENT:  Maria Odom  38 y.o. female  PRE-OPERATIVE DIAGNOSIS:  HNP L4 - L5  POST-OPERATIVE DIAGNOSIS:  HNP L4 - L5  PROCEDURE:  Procedure(s): MICRO LUMBAR DECOMPRESSION L4 - L5 1 LEVEL (N/A)  SURGEON:  Surgeon(s) and Role:    * Javier DockerJeffrey C Keilly Fatula, MD - Primary  PHYSICIAN ASSISTANT:   ASSISTANTS: Bissell   ANESTHESIA:   general  EBL:  Total I/O In: 1000 [I.V.:1000] Out: -   BLOOD ADMINISTERED:none  DRAINS: none   LOCAL MEDICATIONS USED:  MARCAINE     SPECIMEN:  No Specimen and Source of Specimen:  L45  DISPOSITION OF SPECIMEN:  PATHOLOGY  COUNTS:  YES  TOURNIQUET:  * No tourniquets in log *  DICTATION: .Other Dictation: Dictation Number [id number]  PLAN OF CARE: Admit for overnight observation  PATIENT DISPOSITION:  PACU - hemodynamically stable.   Delay start of Pharmacological VTE agent (>24hrs) due to surgical blood loss or risk of bleeding: yes

## 2014-03-12 NOTE — Discharge Instructions (Signed)
Walk As Tolerated utilizing back precautions.  No bending, twisting, or lifting.  No driving for 2 weeks.   May shower with aquacel dressing in place. If the dressing becomes saturated or peels off, may remove aquacel dressing and place gauze and tape dressing which should be kept clean and dry and changed daily. Do not remove steri-strips if they are present. See Dr. Shelle IronBeane in office in 10 to 14 days. Begin taking aspirin 81mg  per day starting 4 days after your surgery if not allergic to aspirin or on another blood thinner. Walk daily even outside. Use a cane or walker only if necessary. Avoid sitting on soft sofas.

## 2014-03-12 NOTE — Interval H&P Note (Signed)
History and Physical Interval Note:  03/12/2014 8:19 AM  Maria ReefBrandi J Mika  has presented today for surgery, with the diagnosis of HNP L4 - L5  The various methods of treatment have been discussed with the patient and family. After consideration of risks, benefits and other options for treatment, the patient has consented to  Procedure(s): MICRO LUMBAR DECOMPRESSION L4 - L5 1 LEVEL (N/A) as a surgical intervention .  The patient's history has been reviewed, patient examined, no change in status, stable for surgery.  I have reviewed the patient's chart and labs.  Questions were answered to the patient's satisfaction.     Rajesh Wyss C

## 2014-03-12 NOTE — Op Note (Signed)
NAMEFELIPE, CABELL NO.:  [id number]  MEDICAL RECORD NO.:  [id number]  LOCATION:  1613                         FACILITY:  [facility]  PHYSICIAN:  Jene Every, M.D.    DATE OF BIRTH:  March 25, 1976  DATE OF PROCEDURE: DATE OF DISCHARGE:                              OPERATIVE REPORT   PREOPERATIVE DIAGNOSIS:  Spinal stenosis, herniated nucleus pulposus at L4-5, left.  POSTOPERATIVE DIAGNOSIS:  Spinal stenosis, herniated nucleus pulposus at L4-5, left.  PROCEDURE PERFORMED:  Microlumbar decompression L4-5, left; foraminotomies, L4-L5, left.  ANESTHESIA:  General.  ASSISTANT:  Lanna Poche, PA  HISTORY:  A 38 year old, HNP, neural tension signs, EHL, weakness on the left, HNP at 4-5, disk degeneration, no instability indicated for decompression.  Minimal symptoms on the left.  Pelvic decompressor from the minimal symptoms on the right and felt we could decompress it from the left.  Risk and benefits were discussed including bleeding, infection, damage to neurovascular structures, DVT, PE, anesthetic complications, etc.  TECHNIQUE:  With the patient in supine position, after induction of adequate general anesthesia, 2 g Kefzol, placed prone on the [place] frame.  All bony prominences were well padded.  Lumbar region was prepped and draped in usual sterile fashion.  Two 18-gauge spinal needle was utilized to localize 4-5 interspace confirmed with x-ray.  Incision was made from the spinous process 4-5.  Subcutaneous tissue was dissected.  Electrocautery utilized to achieve hemostasis.  Dorsolumbar fascia was identified and divided in line with skin incision. Paraspinous muscle elevated from lamina 4 and 5, Marcaine with epinephrine was infiltrated in the paramuscular tissues.  McCullough retractor was placed.  Confirmatory radiographs obtained in the 4-5 space.  Operating microscope was draped and brought into the surgical field.  Hemilaminotomy of the  caudad edge of 4 was performed with a 3 mm Kerrison.  A 2 mm Kerrison preserving the pars detaching the ligamentum flavum.  Ligamentum flavum removed from the interspace.  Neuro patty placed beneath the ligamentum flavum.  Compression of the 5 root was noted in lateral recess.  Performed foraminotomy of 5, identified the 5 root gently mobilized it medially and the very large disk herniation, central paracentral to the left was noted consistent with that seen on the MRI as well as disk degeneration.  I performed an annulotomy and copious portion of disk material was removed from this space with a straight upbiting pituitary.  Further mobilized with an Epstein and a nerve hook.  Multiple fragments were retrieved and sent to Pathology. Did also perform the diskectomy laterally to the medial border of the pedicle.  Performed a foraminotomy of 4.  There was an extensive epidural venous plexus and bipolar electrocautery was utilized to achieve hemostasis as well as thrombin-soaked Gelfoam.  Copiously irrigated disk space with antibiotic irrigation.  A neural probe was placed at the foramen of 4 and 5, found to be widely patent.  There was 1 cm of excursion of the 5 root medial to the pedicle without tension and no disk herniation noted within the axilla, the shoulder of the root, the foramen, or beneath thecal sac.  No evidence of CSF leakage. All instrumentation was removed.  No active bleeding.  Copiously irrigated.  Dorsolumbar fascia reapproximated with 1 Vicryl, subcu with 2-0, and skin subcuticular.  Sterile dressing applied, placed supine on hospital bed, extubated without difficulty, and transported to the recovery room in satisfactory condition.  The patient tolerated the procedure well.  No complications.  Assistant, Lanna PocheJacqueline Bissell, PA, was used throughout the case for intermittent neural traction, patient position, suction, minimal blood loss, diskectomy, material to  Pathology.     Jene EveryJeffrey Clarivel Callaway, M.D.     Cordelia PenJB/MEDQ  D:  03/12/2014  T:  03/12/2014  Job:  [id number]

## 2014-03-12 NOTE — Plan of Care (Signed)
Problem: Consults Goal: Diagnosis - Spinal Surgery Microdiscectomy     

## 2014-03-13 ENCOUNTER — Encounter (HOSPITAL_COMMUNITY): Payer: Self-pay | Admitting: Specialist

## 2014-03-13 DIAGNOSIS — M5126 Other intervertebral disc displacement, lumbar region: Secondary | ICD-10-CM | POA: Diagnosis not present

## 2014-03-13 MED ORDER — ASPIRIN-ACETAMINOPHEN-CAFFEINE 250-250-65 MG PO TABS: 2.0000 | ORAL_TABLET | Freq: Once | ORAL | Status: AC | PRN

## 2014-03-13 MED ORDER — IBUPROFEN 200 MG PO TABS: 600.0000 mg | ORAL_TABLET | Freq: Once | ORAL | Status: AC | PRN

## 2014-03-13 NOTE — Care Management Note (Signed)
    Page 1 of 1   03/13/2014     10:31:41 AM CARE MANAGEMENT NOTE 03/13/2014  Patient:  Maria Odom,Maria Odom   Account Number:  [id number]  Date Initiated:  03/13/2014  Documentation initiated by:  [facility] LLCJEFFRIES,Cheney Gosch  Subjective/Objective Assessment:   adm: MICRO LUMBAR DECOMPRESSION L4 - L5 1 LEVEL (N/A)     Action/Plan:   discharge planning   Anticipated DC Date:  03/13/2014   Anticipated DC Plan:  HOME/SELF CARE      DC Planning Services  CM consult      Choice offered to / List presented to:             Status of service:  Completed, signed off Medicare Important Message given?   (If response is "NO", the following Medicare IM given date fields will be blank) Date Medicare IM given:   Medicare IM given by:   Date Additional Medicare IM given:   Additional Medicare IM given by:    Discharge Disposition:  HOME/SELF CARE  Per UR Regulation:  Reviewed for med. necessity/level of care/duration of stay  If discussed at Long Length of Stay Meetings, dates discussed:    Comments:  03/13/14 10:29 CM notes no HHPT/OT ordered; pt refuses 3n1; no other Cm needs were communicated.  Freddy JakschSarah Korynn Kenedy, BSN, CM [phone].

## 2014-03-13 NOTE — Progress Notes (Signed)
Subjective: 1 Day Post-Op Procedure(s) (LRB): MICRO LUMBAR DECOMPRESSION L4 - L5 1 LEVEL (N/A) Patient reports pain as moderate.  Leg pain resolved. Noting soreness in her lower back with walking and motion. She has been OOB with PT. Voiding without difficulty. No other c/o. Family in room. Seen by myself and Dr. Shelle IronBeane.  Objective: Vital signs in last 24 hours: Temp:  [98.1 F (36.7 C)-99.4 F (37.4 C)] 98.1 F (36.7 C) (10/08 1008) Pulse Rate:  [81-118] 90 (10/08 1008) Resp:  [3-16] 16 (10/08 1008) BP: (92-124)/(52-77) 93/52 mmHg (10/08 1008) SpO2:  [94 %-100 %] 97 % (10/08 1008)  Intake/Output from previous day: 10/07 0701 - 10/08 0700 In: 1800 [P.O.:100; I.V.:1700] Out: 1001 [Urine:1001] Intake/Output this shift: Total I/O In: -  Out: 800 [Urine:800]  No results found for this basename: HGB,  in the last 72 hours No results found for this basename: WBC, RBC, HCT, PLT,  in the last 72 hours No results found for this basename: NA, K, CL, CO2, BUN, CREATININE, GLUCOSE, CALCIUM,  in the last 72 hours No results found for this basename: LABPT, INR,  in the last 72 hours  Neurologically intact ABD soft Neurovascular intact Sensation intact distally Intact pulses distally Dorsiflexion/Plantar flexion intact Incision: dressing C/D/I and no drainage No cellulitis present Compartment soft no calf pain or sign of DVT  Assessment/Plan: 1 Day Post-Op Procedure(s) (LRB): MICRO LUMBAR DECOMPRESSION L4 - L5 1 LEVEL (N/A) Advance diet Up with therapy D/C IV fluids Discussed D/C instructions, dressing instructions, Lspine precautions Plan D/C later today to home Follow up 10-14 days post-op with Dr. Shelle IronBeane for suture removal/recheck  Maria Odom, Lockie ParesJACLYN Odom. 03/13/2014, 10:09 AM

## 2014-03-13 NOTE — Evaluation (Signed)
Physical Therapy Evaluation Patient Details Name: Maria Odom MRN: [id number] DOB: 10-31-1975 Today's Date: 03/13/2014   History of Present Illness  Pt is a 38 year old female s/p L4-5 decompression due to HNP.  Clinical Impression  Patient is s/p lumbar decompression surgery resulting in functional limitations due to the deficits listed below (see PT Problem List).  Patient will benefit from skilled PT to increase their independence and safety with mobility to allow discharge to the venue listed below.  Pt mostly limited by pain at this time.  Reviewed back precautions during mobility and recommended RW for home.  Pt denies LE symptoms at this time.  Pt with planned d/c later today however will see if happens to remain in acute.     Follow Up Recommendations No PT follow up    Equipment Recommendations  Rolling walker with 5" wheels    Recommendations for Other Services       Precautions / Restrictions Precautions Precautions: Back Precaution Comments: reviewed back precautions Restrictions Weight Bearing Restrictions: No      Mobility  Bed Mobility Overal bed mobility: Modified Independent             General bed mobility comments: pt performed log roll technique without cues, increased time  Transfers Overall transfer level: Needs assistance Equipment used: Rolling walker (2 wheeled) Transfers: Sit to/from Stand Sit to Stand: Min guard         General transfer comment: verbal cues for hand placement  Ambulation/Gait Ambulation/Gait assistance: Min guard;Supervision Ambulation Distance (Feet): 120 Feet Assistive device: Rolling walker (2 wheeled) Gait Pattern/deviations: Step-through pattern;Decreased stride length Gait velocity: decr Gait velocity interpretation: Below normal speed for age/gender General Gait Details: slow but steady pace, verbal cues for safe use of RW  Stairs            Wheelchair Mobility    Modified Rankin (Stroke  Patients Only)       Balance                                             Pertinent Vitals/Pain Pain Assessment: 0-10 Pain Score: 3  Pain Location: back Pain Descriptors / Indicators: Aching;Sore Pain Intervention(s): Limited activity within patient's tolerance;Monitored during session;Patient requesting pain meds-RN notified    Home Living Family/patient expects to be discharged to:: Private residence Living Arrangements: Spouse/significant other;Children Available Help at Discharge: Family Type of Home: House Home Access: Stairs to enter Entrance Stairs-Rails: None Secretary/administrator of Steps: 1 Home Layout: One level Home Equipment: None      Prior Function Level of Independence: Independent               Hand Dominance        Extremity/Trunk Assessment   Upper Extremity Assessment: Overall WFL for tasks assessed           Lower Extremity Assessment: Overall WFL for tasks assessed         Communication   Communication: No difficulties  Cognition Arousal/Alertness: Awake/alert Behavior During Therapy: WFL for tasks assessed/performed Overall Cognitive Status: Within Functional Limits for tasks assessed                      General Comments      Exercises        Assessment/Plan    PT Assessment Patient needs continued PT services  PT Diagnosis Difficulty walking;Acute pain   PT Problem List Decreased mobility;Pain;Decreased knowledge of precautions;Decreased knowledge of use of DME  PT Treatment Interventions Gait training;DME instruction;Functional mobility training;Therapeutic activities;Stair training;Therapeutic exercise;Patient/family education   PT Goals (Current goals can be found in the Care Plan section) Acute Rehab PT Goals PT Goal Formulation: With patient Time For Goal Achievement: 03/15/14 Potential to Achieve Goals: Good    Frequency Min 5X/week   Barriers to discharge         Co-evaluation               End of Session   Activity Tolerance: Patient limited by pain Patient left: with call bell/phone within reach;in bed;with family/visitor present      Functional Assessment Tool Used: clinical judgement Functional Limitation: Mobility: Walking and moving around Mobility: Walking and Moving Around Current Status ([id number]([id number]): At least 1 percent but less than 20 percent impaired, limited or restricted Mobility: Walking and Moving Around Goal Status [phone]([id number]): 0 percent impaired, limited or restricted    Time: 1032-1048 PT Time Calculation (min): 16 min   Charges:   PT Evaluation $Initial PT Evaluation Tier I: 1 Procedure PT Treatments $Gait Training: 8-22 mins   PT G Codes:   Functional Assessment Tool Used: clinical judgement Functional Limitation: Mobility: Walking and moving around    Grand LedgeLEMYRE,KATHrine E 03/13/2014, 11:25 AM Zenovia JarredKati Franziska Podgurski, PT, DPT 03/13/2014 Pager: [phone]

## 2014-03-13 NOTE — Progress Notes (Signed)
CSW consulted for SNF placement. PN reviewed. Pt plans to return home following hospital d/c. PT notes no follow up required.  Cori RazorJamie Elim Economou LCSW [phone]

## 2014-03-13 NOTE — Discharge Summary (Signed)
Physician Discharge Summary   Patient ID: Maria Odom MRN: [id number] DOB/AGE: 02/07/1976 38 y.o.  Admit date: 03/12/2014 Discharge date: 03/13/2014  Primary Diagnosis:   HNP L4 - L5  Admission Diagnoses:  Past Medical History  Diagnosis Date  . Headache   . HNP (herniated nucleus pulposus), lumbar   . GERD (gastroesophageal reflux disease)     TAKES TUMS AS NEEDED   Discharge Diagnoses:   Principal Problem:   HNP (herniated nucleus pulposus), lumbar  Procedure:  Procedure(s) (LRB): MICRO LUMBAR DECOMPRESSION L4 - L5 1 LEVEL (N/A)   Consults: None  HPI:  see H&P    Laboratory Data: Hospital Outpatient Visit on 03/07/2014  Component Date Value Ref Range Status  . Sodium 03/07/2014 137  137 - 147 mEq/L Final  . Potassium 03/07/2014 4.3  3.7 - 5.3 mEq/L Final  . Chloride 03/07/2014 100  96 - 112 mEq/L Final  . CO2 03/07/2014 24  19 - 32 mEq/L Final  . Glucose, Bld 03/07/2014 93  70 - 99 mg/dL Final  . BUN 03/07/2014 10  6 - 23 mg/dL Final  . Creatinine, Ser 03/07/2014 0.79  0.50 - 1.10 mg/dL Final  . Calcium 03/07/2014 9.6  8.4 - 10.5 mg/dL Final  . GFR calc non Af Amer 03/07/2014 >90  >90 mL/min Final  . GFR calc Af Amer 03/07/2014 >90  >90 mL/min Final   Comment: (NOTE)                          The eGFR has been calculated using the CKD EPI equation.                          This calculation has not been validated in all clinical situations.                          eGFR's persistently <90 mL/min signify possible Chronic Kidney                          Disease.  . Anion gap 03/07/2014 13  5 - 15 Final  . WBC 03/07/2014 6.3  4.0 - 10.5 K/uL Final  . RBC 03/07/2014 4.53  3.87 - 5.11 MIL/uL Final  . Hemoglobin 03/07/2014 14.4  12.0 - 15.0 g/dL Final  . HCT 03/07/2014 42.6  36.0 - 46.0 % Final  . MCV 03/07/2014 94.0  78.0 - 100.0 fL Final  . MCH 03/07/2014 31.8  26.0 - 34.0 pg Final  . MCHC 03/07/2014 33.8  30.0 - 36.0 g/dL Final  . RDW 03/07/2014 12.4  11.5  - 15.5 % Final  . Platelets 03/07/2014 296  150 - 400 K/uL Final  . Preg, Serum 03/07/2014 NEGATIVE  NEGATIVE Final   Comment:                                 THE SENSITIVITY OF THIS                          METHODOLOGY IS >10 mIU/mL.  . MRSA, PCR 03/07/2014 NEGATIVE  NEGATIVE Final  . Staphylococcus aureus 03/07/2014 NEGATIVE  NEGATIVE Final   Comment:                                   The Xpert SA Assay (FDA                          approved for NASAL specimens                          in patients over 21 years of age),                          is one component of                          a comprehensive surveillance                          program.  Test performance has                          been validated by [facility] for patients greater                          than or equal to 1 year old.                          It is not intended                          to diagnose infection nor to                          guide or monitor treatment.   No results found for this basename: HGB,  in the last 72 hours No results found for this basename: WBC, RBC, HCT, PLT,  in the last 72 hours No results found for this basename: NA, K, CL, CO2, BUN, CREATININE, GLUCOSE, CALCIUM,  in the last 72 hours No results found for this basename: LABPT, INR,  in the last 72 hours  X-Rays:Dg Lumbar Spine 2-3 Views  03/07/2014   CLINICAL DATA:  Preoperative for lumbar decompression. Disc protrusion at the L4-5 level.  EXAM: LUMBAR SPINE - 2-3 VIEW  COMPARISON:  None.  FINDINGS: There are 5 lumbar type non-rib-bearing vertebra labeled L1 through L5.  Mild loss of intervertebral disc height at the L4-5 level. There is potentially mild facet arthropathy at L4-5 and L5-S1. No malalignment or fracture.  IMPRESSION: 1. Mild loss of intervertebral disc height at the L4-5 level. Minimal lower lumbar facet arthropathy.   Electronically Signed   By: Walt  Liebkemann M.D.   On: 03/07/2014  10:05   Dg Spine Portable 1 View  03/12/2014   CLINICAL DATA:  Lumbar laminectomy  EXAM: PORTABLE SPINE - 1 VIEW  COMPARISON:  Preoperative lumbar spine films of 03/07/2014  FINDINGS: A cross-table lateral view from the operating room labeled #3 was returned.  A clamp is present on the spinous process of L4. The instrument localization is located just above this clamp, directed toward the mid body of L4. More caudally there is ad instrument just beneath the spinous process of L4 directed toward the L4-5 interspace.  IMPRESSION: Clamp on spinous process   of L4 with the more cephalad instrument directed toward the mid body of L4.   Electronically Signed   By: Paul  Barry M.D.   On: 03/12/2014 13:02   Dg Spine Portable 1 View  03/12/2014   CLINICAL DATA:  Lumbar laminectomy  EXAM: PORTABLE SPINE - 1 VIEW  COMPARISON:  Preoperative lumbar spine films of 03/07/2014  FINDINGS: An instrument for localization courses beneath the spinous process of L4, directed toward the posterior elements and midbody of L4.  IMPRESSION: Instrument for localization is just beneath the spinous process of L4.   Electronically Signed   By: Paul  Barry M.D.   On: 03/12/2014 12:59   Dg Spine Portable 1 View  03/12/2014   CLINICAL DATA:  Lumbar laminectomy and decompression microdiscectomy at L4-L5, lumbar back pain  EXAM: PORTABLE SPINE - 1 VIEW  COMPARISON:  Portable cross-table lateral intraoperative view compared to preoperative images of 03/07/2014.  FINDINGS: Five lumbar vertebrae labeled on prior exam.  Metallic probes via dorsal approach project dorsal to the inferior margins of the spinous processes of L3 and L4.  Disc space narrowing L4-L5 with tiny inferior L4 endplate spur.  IMPRESSION: Dorsal localization of the inferior aspects of the L3 and L4 spinous processes.   Electronically Signed   By: Mark  Boles M.D.   On: 03/12/2014 12:37    EKG:No orders found for this or any previous visit.   Hospital Course: Patient was  admitted to [facility] and taken to the OR and underwent the above state procedure without complications.  Patient tolerated the procedure well and was later transferred to the recovery room and then to the orthopaedic floor for postoperative care.  They were given PO and IV analgesics for pain control following their surgery.  They were given 24 hours of postoperative antibiotics.   PT was consulted postop to assist with mobility and transfers.  The patient was allowed to be WBAT with therapy and was taught back precautions. Discharge planning was consulted to help with postop disposition and equipment needs.  Patient had a fair night on the evening of surgery and started to get up OOB with therapy on day one. Patient was seen in rounds and was ready to go home on day one.  They were given discharge instructions and dressing directions.  They were instructed on when to follow up in the office with Dr. Beane.   Diet: Regular diet Activity:WBAT; Lspine precautions Follow-up:in 10-14 days Disposition - Home Discharged Condition: good   Discharge Instructions   Call MD / Call 911    Complete by:  As directed   If you experience chest pain or shortness of breath, CALL 911 and be transported to the hospital emergency room.  If you develope a fever above 101 F, pus (white drainage) or increased drainage or redness at the wound, or calf pain, call your surgeon's office.     Constipation Prevention    Complete by:  As directed   Drink plenty of fluids.  Prune juice may be helpful.  You may use a stool softener, such as Colace (over the counter) 100 mg twice a day.  Use MiraLax (over the counter) for constipation as needed.     Diet - low sodium heart healthy    Complete by:  As directed      Increase activity slowly as tolerated    Complete by:  As directed             Medication List           aspirin-acetaminophen-caffeine 433-295-18 MG per tablet  Commonly known as:  EXCEDRIN MIGRAINE   Take 2 tablets by mouth once as needed for headache or migraine.     docusate sodium 100 MG capsule  Commonly known as:  COLACE  Take 1 capsule (100 mg total) by mouth 2 (two) times daily as needed for mild constipation.     esomeprazole 20 MG capsule  Commonly known as:  NEXIUM  Take 20 mg by mouth daily at 12 noon.     ibuprofen 200 MG tablet  Commonly known as:  ADVIL,MOTRIN  Take 3-4 tablets (600-800 mg total) by mouth once as needed for headache or mild pain.     methocarbamol 500 MG tablet  Commonly known as:  ROBAXIN  Take 1 tablet (500 mg total) by mouth 3 (three) times daily between meals as needed for muscle spasms.     omeprazole 20 MG tablet  Commonly known as:  PRILOSEC OTC  Take 20 mg by mouth as needed.     oxyCODONE-acetaminophen 5-325 MG per tablet  Commonly known as:  PERCOCET  Take 1 tablet by mouth every 4 (four) hours as needed.           Follow-up Information   Follow up with BEANE,JEFFREY C, MD In 2 weeks. (For suture removal)    Specialty:  Orthopedic Surgery   Contact information:   [address] [place] [address] [phone]       Signed: Lacie Draft, PA-C Orthopaedic Surgery 03/13/2014, 10:15 AM

## 2014-03-13 NOTE — Evaluation (Signed)
Occupational Therapy Evaluation Patient Details Name: Maria Odom MRN: [id number] DOB: March 23, 1976 Today's Date: 03/13/2014    History of Present Illness pt is s/p L4-5 decompression due to HNP   Clinical Impression   This 38 year old female was admitted for the above surgery.  All education was completed. No follow up OT is needed, and she does not want DME for bathroom at this time.    Follow Up Recommendations  No OT follow up    Equipment Recommendations  None recommended by OT (pt does not want 3;1 commode)    Recommendations for Other Services       Precautions / Restrictions Precautions Precautions: Back, handout given Restrictions Weight Bearing Restrictions: No      Mobility Bed Mobility Overal bed mobility: Modified Independent             General bed mobility comments: good technique, extra time  Transfers Overall transfer level: Needs assistance Equipment used: Rolling walker (2 wheeled) Transfers: Sit to/from Stand Sit to Stand: Min guard         General transfer comment: close guard for safety:  cues for hand placement.  Pt had good technique    Balance                                            ADL Overall ADL's : Needs assistance/impaired                         Toilet Transfer: Min guard;Ambulation (to bathroom then to recliner)   Toileting- Clothing Manipulation and Hygiene: Moderate assistance;Sit to/from stand   Tub/ Shower Transfer: Min guard;Ambulation;Walk-in shower     General ADL Comments: pt is able to complete UB adls.  Reviewed back precautions and ADLs::  explained safe positions to perform LB adls as well as AE.  Pt has had husband and children help her for this and will continue to do so.  She needs max A for LB adls.  Will return and show her toilet aide as she would benefit from this.  Pt has a comfort height commode and feels she will be fine with this.  Demonstrated sitting backwards on  commode if needed. She has a walk in shower--practiced stepping backwards into this.  Educated to only wash what she can reach, keeping both legs on floor and having assistance later to wash legs.  Attempted to sit up in recliner, but pt had increased pain and could not get comfortable.  Educated to sit in straightback chair at home and to move often     Vision                     Perception     Praxis      Pertinent Vitals/Pain Pain Assessment: 0-10 Pain Score: 5  (to 9 (when attempting to sit in chair) Pain Location: back Pain Descriptors / Indicators: Aching Pain Intervention(s): Limited activity within patient's tolerance;Monitored during session;Premedicated before session;Repositioned     Hand Dominance     Extremity/Trunk Assessment Upper Extremity Assessment Upper Extremity Assessment: Overall WFL for tasks assessed           Communication Communication Communication: No difficulties   Cognition Arousal/Alertness: Awake/alert Behavior During Therapy: WFL for tasks assessed/performed Overall Cognitive Status: Within Functional Limits for tasks assessed  General Comments       Exercises       Shoulder Instructions      Home Living Family/patient expects to be discharged to:: Private residence Living Arrangements: Spouse/significant other;Children (3 teenagers)                 Bathroom Shower/Tub: Producer, television/film/video: Handicapped height                Prior Functioning/Environment Level of Independence: Needs assistance (LB dressing)             OT Diagnosis: Acute pain;Generalized weakness   OT Problem List:     OT Treatment/Interventions:      OT Goals(Current goals can be found in the care plan section)    OT Frequency:     Barriers to D/C:            Co-evaluation              End of Session    Activity Tolerance: Patient limited by pain;Patient limited by fatigue  (fatiques quickly) Patient left: in bed;with call bell/phone within reach   Time: 0911-0936 OT Time Calculation (min): 25 min Charges:  OT General Charges $OT Visit: 1 Procedure OT Evaluation $Initial OT Evaluation Tier I: 1 Procedure OT Treatments $Self Care/Home Management : 8-22 mins G-Codes: OT G-codes **NOT FOR INPATIENT CLASS** Functional Assessment Tool Used: clinical judgment and observation Functional Limitation: Self care Self Care Current Status (Z6109): At least 60 percent but less than 80 percent impaired, limited or restricted Self Care Goal Status (U0454): At least 60 percent but less than 80 percent impaired, limited or restricted Self Care Discharge Status [phone]): At least 60 percent but less than 80 percent impaired, limited or restricted  [facility] 03/13/2014, 9:57 AM  Marica Otter, OTR/L [phone] 03/13/2014

## 2014-03-14 ENCOUNTER — Encounter (HOSPITAL_COMMUNITY): Payer: Self-pay | Admitting: Emergency Medicine

## 2014-03-14 ENCOUNTER — Emergency Department (HOSPITAL_COMMUNITY)
Admission: EM | Admit: 2014-03-14 | Discharge: 2014-03-14 | Disposition: A | Discharge status: Home / Self Care | Payer: 59 | Attending: Emergency Medicine | Admitting: Emergency Medicine

## 2014-03-14 DIAGNOSIS — Z79899 Other long term (current) drug therapy: Secondary | ICD-10-CM | POA: Insufficient documentation

## 2014-03-14 DIAGNOSIS — Z9889 Other specified postprocedural states: Secondary | ICD-10-CM | POA: Insufficient documentation

## 2014-03-14 DIAGNOSIS — R110 Nausea: Secondary | ICD-10-CM | POA: Insufficient documentation

## 2014-03-14 DIAGNOSIS — K219 Gastro-esophageal reflux disease without esophagitis: Secondary | ICD-10-CM | POA: Diagnosis not present

## 2014-03-14 DIAGNOSIS — G8918 Other acute postprocedural pain: Secondary | ICD-10-CM | POA: Diagnosis not present

## 2014-03-14 DIAGNOSIS — M5126 Other intervertebral disc displacement, lumbar region: Secondary | ICD-10-CM | POA: Insufficient documentation

## 2014-03-14 DIAGNOSIS — R55 Syncope and collapse: Secondary | ICD-10-CM | POA: Insufficient documentation

## 2014-03-14 DIAGNOSIS — Z3202 Encounter for pregnancy test, result negative: Secondary | ICD-10-CM | POA: Diagnosis not present

## 2014-03-14 LAB — CBC WITH DIFFERENTIAL/PLATELET
Basophils Absolute: 0 10*3/uL (ref 0.0–0.1)
Basophils Relative: 0 % (ref 0–1)
EOS ABS: 0 10*3/uL (ref 0.0–0.7)
Eosinophils Relative: 0 % (ref 0–5)
HEMATOCRIT: 36.5 % (ref 36.0–46.0)
HEMOGLOBIN: 12.2 g/dL (ref 12.0–15.0)
LYMPHS ABS: 1.7 10*3/uL (ref 0.7–4.0)
Lymphocytes Relative: 14 % (ref 12–46)
MCH: 31.9 pg (ref 26.0–34.0)
MCHC: 33.4 g/dL (ref 30.0–36.0)
MCV: 95.5 fL (ref 78.0–100.0)
MONOS PCT: 8 % (ref 3–12)
Monocytes Absolute: 0.9 10*3/uL (ref 0.1–1.0)
NEUTROS PCT: 78 % — AB (ref 43–77)
Neutro Abs: 9.1 10*3/uL — ABNORMAL HIGH (ref 1.7–7.7)
Platelets: 218 10*3/uL (ref 150–400)
RBC: 3.82 MIL/uL — AB (ref 3.87–5.11)
RDW: 12.6 % (ref 11.5–15.5)
WBC: 11.7 10*3/uL — ABNORMAL HIGH (ref 4.0–10.5)

## 2014-03-14 LAB — URINALYSIS, ROUTINE W REFLEX MICROSCOPIC
Bilirubin Urine: NEGATIVE
GLUCOSE, UA: NEGATIVE mg/dL
Hgb urine dipstick: NEGATIVE
Ketones, ur: NEGATIVE mg/dL
Leukocytes, UA: NEGATIVE
Nitrite: NEGATIVE
PH: 6.5 (ref 5.0–8.0)
Protein, ur: NEGATIVE mg/dL
Specific Gravity, Urine: 1.008 (ref 1.005–1.030)
Urobilinogen, UA: 0.2 mg/dL (ref 0.0–1.0)

## 2014-03-14 LAB — BASIC METABOLIC PANEL
ANION GAP: 12 (ref 5–15)
BUN: 11 mg/dL (ref 6–23)
CHLORIDE: 99 meq/L (ref 96–112)
CO2: 27 meq/L (ref 19–32)
CREATININE: 0.86 mg/dL (ref 0.50–1.10)
Calcium: 8.5 mg/dL (ref 8.4–10.5)
GFR calc Af Amer: >90 mL/min (ref >90)
GFR calc non Af Amer: 83 mL/min — ABNORMAL LOW (ref >90)
Glucose, Bld: 99 mg/dL (ref 70–99)
Potassium: 3.8 mEq/L (ref 3.7–5.3)
Sodium: 138 mEq/L (ref 137–147)

## 2014-03-14 LAB — POC URINE PREG, ED: Preg Test, Ur: NEGATIVE

## 2014-03-14 MED ORDER — FENTANYL CITRATE 0.05 MG/ML IJ SOLN
100.0000 ug | Freq: Once | INTRAMUSCULAR | Status: AC
  Administered 2014-03-14: 100 ug via INTRAVENOUS
  Filled 2014-03-14: qty 2

## 2014-03-14 MED ORDER — SODIUM CHLORIDE 0.9 % IV BOLUS (SEPSIS)
1000.0000 mL | Freq: Once | INTRAVENOUS | Status: AC
  Administered 2014-03-14: 1000 mL via INTRAVENOUS

## 2014-03-14 MED ORDER — ONDANSETRON HCL 4 MG/2ML IJ SOLN
4.0000 mg | Freq: Once | INTRAMUSCULAR | Status: AC
  Administered 2014-03-14: 4 mg via INTRAVENOUS
  Filled 2014-03-14: qty 2

## 2014-03-14 MED ORDER — HYDROMORPHONE HCL 1 MG/ML IJ SOLN
1.0000 mg | Freq: Once | INTRAMUSCULAR | Status: AC
  Administered 2014-03-14: 1 mg via INTRAVENOUS
  Filled 2014-03-14: qty 1

## 2014-03-14 NOTE — Discharge Instructions (Signed)
Please read and follow all provided instructions.  Your diagnoses today include:  1. Vasovagal syncope   2. Post-operative pain     Tests performed today include:  Vital signs - see below for your results today  Blood counts and electrolytes - no concerning findings  EKG - normal  Urine test - normal  Medications prescribed:   None  Take any prescribed medications only as directed.  Home care instructions:   Follow any educational materials contained in this packet  Please rest, use ice or heat on your back for the next several days  Do not lift, push, pull anything more than 10 pounds for the next week  Follow-up instructions: Please follow-up with your orthopedic surgeon as planned.   Return instructions:  SEEK IMMEDIATE MEDICAL ATTENTION IF YOU HAVE:  New numbness, tingling, weakness, or problem with the use of your arms or legs  Severe back pain not relieved with medications  Loss control of your bowels or bladder  Increasing pain in any areas of the body (such as chest or abdominal pain)  Shortness of breath, dizziness, or fainting.   Worsening nausea (feeling sick to your stomach), vomiting, fever, or sweats  Any other emergent concerns regarding your health   Additional Information:  Your vital signs today were: BP 104/61   Pulse 102   Temp(Src) 98.3 F (36.8 C) (Oral)   Resp 20   SpO2 97%   LMP 02/15/2014 If your blood pressure (BP) was elevated above 135/85 this visit, please have this repeated by your doctor within one month. --------------

## 2014-03-14 NOTE — ED Notes (Signed)
Patient is alert and oriented x3.  Patient is complaining of syncopal episode.  Patient husband told EMS  That patient was out for approximately 4 seconds.  Patient denies any previous history or syncope.

## 2014-03-14 NOTE — ED Notes (Signed)
EKG given to EDP,Ward,MD. For review. 

## 2014-03-14 NOTE — ED Provider Notes (Signed)
CSN: [id number]     Arrival date & time 03/14/14  0609 History   First MD Initiated Contact with Patient 03/14/14 [phone]     Chief Complaint  Patient presents with  . Loss of Consciousness     (Consider location/radiation/quality/duration/timing/severity/associated sxs/prior Treatment) HPI Comments: Patient with history of lower back surgery 2 days ago by Dr. Shelle Iron presents with syncopal episode. Husband states that patient last took Percocet last night and also ask her 4 AM today. Patient awoke in severe pain. Her husband gave her an additional muscle relaxer. She stood up out of bed and walked to a chair. Husband states that she was complaining of feeling very nauseous when she was very uncomfortable in pain. He noted that she became unresponsive for about 4 seconds. He states that she was very pale. No seizure activity noted. She gradually came to. Patient denies any chest pain, shortness of breath, leg swelling. She does not have any history of DVT or PE. She has not had cough. Patient admits to not eating and drinking well over the past several days because she has felt very bad. She otherwise denies diarrhea or urinary symptoms. No fever or chills. No history of arrhythmia or syncope. The onset of this condition was acute. The course is resolved. Aggravating factors: none. Alleviating factors: none.    The history is provided by the patient, medical records and a relative.    Past Medical History  Diagnosis Date  . Headache   . HNP (herniated nucleus pulposus), lumbar   . GERD (gastroesophageal reflux disease)     TAKES TUMS AS NEEDED   Past Surgical History  Procedure Laterality Date  . Lumbar laminectomy/decompression microdiscectomy N/A 03/12/2014    Procedure: MICRO LUMBAR DECOMPRESSION L4 - L5 1 LEVEL;  Surgeon: Javier Docker, MD;  Location: [facility];  Service: Orthopedics;  Laterality: N/A;   History reviewed. No pertinent family history. History  Substance Use Topics  .  Smoking status: Never Smoker   . Smokeless tobacco: Not on file  . Alcohol Use: No   OB History   Grav Para Term Preterm Abortions TAB SAB Ect Mult Living                 Review of Systems  Constitutional: Negative for fever.  HENT: Negative for rhinorrhea and sore throat.   Eyes: Negative for redness.  Respiratory: Negative for cough.   Cardiovascular: Negative for chest pain.  Gastrointestinal: Positive for nausea. Negative for vomiting, abdominal pain and diarrhea.  Genitourinary: Negative for dysuria.  Musculoskeletal: Positive for back pain (s/p surgery). Negative for myalgias.  Skin: Negative for rash.  Neurological: Positive for syncope. Negative for headaches.      Allergies  Review of patient's allergies indicates no known allergies.  Home Medications   Prior to Admission medications   Medication Sig Start Date End Date Taking? Authorizing Provider  aspirin-acetaminophen-caffeine (EXCEDRIN MIGRAINE) [phone] MG per tablet Take 2 tablets by mouth once as needed for headache or migraine. 03/13/14   Dayna Barker. Bissell, PA-C  docusate sodium (COLACE) 100 MG capsule Take 1 capsule (100 mg total) by mouth 2 (two) times daily as needed for mild constipation. 03/12/14   Javier Docker, MD  esomeprazole (NEXIUM) 20 MG capsule Take 20 mg by mouth daily at 12 noon.    Historical Provider, MD  ibuprofen (ADVIL,MOTRIN) 200 MG tablet Take 3-4 tablets (600-800 mg total) by mouth once as needed for headache or mild pain. 03/13/14   Lockie Pares  Doralee AlbinoM. Bissell, PA-C  methocarbamol (ROBAXIN) 500 MG tablet Take 1 tablet (500 mg total) by mouth 3 (three) times daily between meals as needed for muscle spasms. 03/12/14   Javier DockerJeffrey C Beane, MD  omeprazole (PRILOSEC OTC) 20 MG tablet Take 20 mg by mouth as needed.    Historical Provider, MD  oxyCODONE-acetaminophen (PERCOCET) 5-325 MG per tablet Take 1 tablet by mouth every 4 (four) hours as needed. 03/12/14   Javier DockerJeffrey C Beane, MD   BP 94/55  Pulse 94   Temp(Src) 98.1 F (36.7 C) (Oral)  Resp 16  SpO2 97%  LMP 02/15/2014  Physical Exam  Nursing note and vitals reviewed. Constitutional: She appears well-developed and well-nourished.  HENT:  Head: Normocephalic and atraumatic.  Mouth/Throat: Oropharynx is clear and moist.  Eyes: Conjunctivae are normal. Right eye exhibits no discharge. Left eye exhibits no discharge.  Neck: Normal range of motion. Neck supple.  Cardiovascular: Normal rate, regular rhythm and normal heart sounds.   No murmur heard. Pulmonary/Chest: Effort normal and breath sounds normal. No respiratory distress. She has no wheezes. She has no rales.  Abdominal: Soft. There is no tenderness. There is no rebound, no guarding and no CVA tenderness.  Musculoskeletal: Normal range of motion. She exhibits tenderness (lower back).  No step-off noted with palpation of spine.   Neurological: She is alert. She has normal strength and normal reflexes. No sensory deficit.  5/5 strength in entire lower extremities bilaterally. No sensation deficit.   Skin: Skin is warm and dry. No rash noted.  Bandages in place on lower back -- no surrounding drainage or cellulitis, patient is appropriately tender. Palpation does not cause severe pain.   Psychiatric: She has a normal mood and affect.    ED Course  Procedures (including critical care time) Labs Review Labs Reviewed  CBC WITH DIFFERENTIAL - Abnormal; Notable for the following:    WBC 11.7 (*)    RBC 3.82 (*)    Neutrophils Relative % 78 (*)    Neutro Abs 9.1 (*)    All other components within normal limits  BASIC METABOLIC PANEL - Abnormal; Notable for the following:    GFR calc non Af Amer 83 (*)    All other components within normal limits  URINALYSIS, ROUTINE W REFLEX MICROSCOPIC  POC URINE PREG, ED    Imaging Review Dg Spine Portable 1 View  03/12/2014   CLINICAL DATA:  Lumbar laminectomy  EXAM: PORTABLE SPINE - 1 VIEW  COMPARISON:  Preoperative lumbar spine films of  03/07/2014  FINDINGS: A cross-table lateral view from the operating room labeled #3 was returned.  A clamp is present on the spinous process of L4. The instrument localization is located just above this clamp, directed toward the mid body of L4. More caudally there is ad instrument just beneath the spinous process of L4 directed toward the L4-5 interspace.  IMPRESSION: Clamp on spinous process of L4 with the more cephalad instrument directed toward the mid body of L4.   Electronically Signed   By: Dwyane DeePaul  Barry M.D.   On: 03/12/2014 13:02   Dg Spine Portable 1 View  03/12/2014   CLINICAL DATA:  Lumbar laminectomy  EXAM: PORTABLE SPINE - 1 VIEW  COMPARISON:  Preoperative lumbar spine films of 03/07/2014  FINDINGS: An instrument for localization courses beneath the spinous process of L4, directed toward the posterior elements and midbody of L4.  IMPRESSION: Instrument for localization is just beneath the spinous process of L4.   Electronically Signed  By: Dwyane DeePaul  Barry M.D.   On: 03/12/2014 12:59   Dg Spine Portable 1 View  03/12/2014   CLINICAL DATA:  Lumbar laminectomy and decompression microdiscectomy at L4-L5, lumbar back pain  EXAM: PORTABLE SPINE - 1 VIEW  COMPARISON:  Portable cross-table lateral intraoperative view compared to preoperative images of 03/07/2014.  FINDINGS: Five lumbar vertebrae labeled on prior exam.  Metallic probes via dorsal approach project dorsal to the inferior margins of the spinous processes of L3 and L4.  Disc space narrowing L4-L5 with tiny inferior L4 endplate spur.  IMPRESSION: Dorsal localization of the inferior aspects of the L3 and L4 spinous processes.   Electronically Signed   By: Ulyses SouthwardMark  Boles M.D.   On: 03/12/2014 12:37     EKG Interpretation None      7:10 AM Patient seen and examined. Work-up initiated. Medications ordered. Will attempt to make patient feel better with hydration, nausea/pain medications. At this point, I suspect vasovagal syncope due to pain with  element of dehydration/orthostasis given history. Labs ordered to eval for anemia given recent surgery as well as EKG and UPT. She has no CP, SOB, or tachycardia to suggest PE.   Vital signs reviewed and are as follows: BP 94/55  Pulse 94  Temp(Src) 98.1 F (36.7 C) (Oral)  Resp 16  SpO2 97%  LMP 02/15/2014  9:07 AM Patient is doing somewhat better. Informed of results. Work-up is largely unremarkable. Positive orthostatics. She is receiving fluids. Pain better controlled with fentanyl.   Patient history and findings discussed with Dr. Fayrene FearingJames.   Will have patient ambulate and likely d/c to home.   11:00 AM patient improved. She has ambulated well. All results discussed with patient and husband at bedside.  Patient urged to return with intractable pain, loss of bowel or bladder control, trouble walking. Counseled to return with chest pain, shortness of breath, or she passes out again.  The patient verbalizes understanding and agrees with the plan.   Date: 03/14/2014  Rate: 91  Rhythm: normal sinus rhythm  QRS Axis: normal  Intervals: normal  ST/T Wave abnormalities: normal  Conduction Disutrbances:none  Narrative Interpretation: no brugada, prolonged qtc, pre-excitation  Old EKG Reviewed: none available     MDM   Final diagnoses:  Vasovagal syncope  Post-operative pain   Vasovagal syncope: Likely combination of severe pain, orthostasis, mild dehydration, nausea. Workup in the ED is unremarkable including EKG.  Postoperative pain: Patient 2 days status post back surgery. She has no lower extremity neurological symptoms. No fever. Mild leukocytosis likely due to pain. Do not suspect wound infection or complication from surgery.    Renne CriglerJoshua Hosam Mcfetridge, PA-C 03/14/14 1106

## 2014-03-14 NOTE — ED Notes (Signed)
[name] ambulate pt to the bathroom with walker with husband help.

## 2014-03-18 NOTE — Discharge Summary (Signed)
Physician Discharge Summary   Patient ID: Maria Odom MRN: [id number] DOB/AGE: 40/04/1976 40 y.o.  Admit date: 03/12/2014 Discharge date: 03/18/2014  Primary Diagnosis:   HNP L4 - L5  Admission Diagnoses:  Past Medical History  Diagnosis Date  . Headache   . HNP (herniated nucleus pulposus), lumbar   . GERD (gastroesophageal reflux disease)     TAKES TUMS AS NEEDED   Discharge Diagnoses:   Principal Problem:   HNP (herniated nucleus pulposus), lumbar  Procedure:  Procedure(s) (LRB): MICRO LUMBAR DECOMPRESSION L4 - L5 1 LEVEL (N/A)   Consults: None  HPI:  see H&P    Laboratory Data: Hospital Outpatient Visit on 03/07/2014  Component Date Value Ref Range Status  . Sodium 03/07/2014 137  137 - 147 mEq/L Final  . Potassium 03/07/2014 4.3  3.7 - 5.3 mEq/L Final  . Chloride 03/07/2014 100  96 - 112 mEq/L Final  . CO2 03/07/2014 24  19 - 32 mEq/L Final  . Glucose, Bld 03/07/2014 93  70 - 99 mg/dL Final  . BUN 03/07/2014 10  6 - 23 mg/dL Final  . Creatinine, Ser 03/07/2014 0.79  0.50 - 1.10 mg/dL Final  . Calcium 03/07/2014 9.6  8.4 - 10.5 mg/dL Final  . GFR calc non Af Amer 03/07/2014 >90  >90 mL/min Final  . GFR calc Af Amer 03/07/2014 >90  >90 mL/min Final   Comment: (NOTE)                          The eGFR has been calculated using the CKD EPI equation.                          This calculation has not been validated in all clinical situations.                          eGFR's persistently <90 mL/min signify possible Chronic Kidney                          Disease.  . Anion gap 03/07/2014 13  5 - 15 Final  . WBC 03/07/2014 6.3  4.0 - 10.5 K/uL Final  . RBC 03/07/2014 4.53  3.87 - 5.11 MIL/uL Final  . Hemoglobin 03/07/2014 14.4  12.0 - 15.0 g/dL Final  . HCT 03/07/2014 42.6  36.0 - 46.0 % Final  . MCV 03/07/2014 94.0  78.0 - 100.0 fL Final  . MCH 03/07/2014 31.8  26.0 - 34.0 pg Final  . MCHC 03/07/2014 33.8  30.0 - 36.0 g/dL Final  . RDW 03/07/2014 12.4   11.5 - 15.5 % Final  . Platelets 03/07/2014 296  150 - 400 K/uL Final  . Preg, Serum 03/07/2014 NEGATIVE  NEGATIVE Final   Comment:                                 THE SENSITIVITY OF THIS                          METHODOLOGY IS >10 mIU/mL.  . MRSA, PCR 03/07/2014 NEGATIVE  NEGATIVE Final  . Staphylococcus aureus 03/07/2014 NEGATIVE  NEGATIVE Final   Comment:  The Xpert SA Assay (FDA                          approved for NASAL specimens                          in patients over 61 years of age),                          is one component of                          a comprehensive surveillance                          program.  Test performance has                          been validated by [employer] for patients greater                          than or equal to 62 year old.                          It is not intended                          to diagnose infection nor to                          guide or monitor treatment.   No results found for this basename: HGB,  in the last 72 hours No results found for this basename: WBC, RBC, HCT, PLT,  in the last 72 hours No results found for this basename: NA, K, CL, CO2, BUN, CREATININE, GLUCOSE, CALCIUM,  in the last 72 hours No results found for this basename: LABPT, INR,  in the last 72 hours  X-Rays:Dg Lumbar Spine 2-3 Views  03/07/2014   CLINICAL DATA:  Preoperative for lumbar decompression. Disc protrusion at the L4-5 level.  EXAM: LUMBAR SPINE - 2-3 VIEW  COMPARISON:  None.  FINDINGS: There are 5 lumbar type non-rib-bearing vertebra labeled L1 through L5.  Mild loss of intervertebral disc height at the L4-5 level. There is potentially mild facet arthropathy at L4-5 and L5-S1. No malalignment or fracture.  IMPRESSION: 1. Mild loss of intervertebral disc height at the L4-5 level. Minimal lower lumbar facet arthropathy.   Electronically Signed   By: Sherryl Barters M.D.   On:  03/07/2014 10:05   Dg Spine Portable 1 View  03/12/2014   CLINICAL DATA:  Lumbar laminectomy  EXAM: PORTABLE SPINE - 1 VIEW  COMPARISON:  Preoperative lumbar spine films of 03/07/2014  FINDINGS: A cross-table lateral view from the operating room labeled #3 was returned.  A clamp is present on the spinous process of L4. The instrument localization is located just above this clamp, directed toward the mid body of L4. More caudally there is ad instrument just beneath the spinous process of L4 directed toward the L4-5 interspace.  IMPRESSION: Clamp on spinous process  of L4 with the more cephalad instrument directed toward the mid body of L4.   Electronically Signed   By: Ivar Drape M.D.   On: 03/12/2014 13:02   Dg Spine Portable 1 View  03/12/2014   CLINICAL DATA:  Lumbar laminectomy  EXAM: PORTABLE SPINE - 1 VIEW  COMPARISON:  Preoperative lumbar spine films of 03/07/2014  FINDINGS: An instrument for localization courses beneath the spinous process of L4, directed toward the posterior elements and midbody of L4.  IMPRESSION: Instrument for localization is just beneath the spinous process of L4.   Electronically Signed   By: Ivar Drape M.D.   On: 03/12/2014 12:59   Dg Spine Portable 1 View  03/12/2014   CLINICAL DATA:  Lumbar laminectomy and decompression microdiscectomy at L4-L5, lumbar back pain  EXAM: PORTABLE SPINE - 1 VIEW  COMPARISON:  Portable cross-table lateral intraoperative view compared to preoperative images of 03/07/2014.  FINDINGS: Five lumbar vertebrae labeled on prior exam.  Metallic probes via dorsal approach project dorsal to the inferior margins of the spinous processes of L3 and L4.  Disc space narrowing L4-L5 with tiny inferior L4 endplate spur.  IMPRESSION: Dorsal localization of the inferior aspects of the L3 and L4 spinous processes.   Electronically Signed   By: Lavonia Dana M.D.   On: 03/12/2014 12:37    EKG:No orders found for this or any previous visit.   Hospital Course:  Patient was admitted to [facility] and taken to the OR and underwent the above state procedure without complications.  Patient tolerated the procedure well and was later transferred to the recovery room and then to the orthopaedic floor for postoperative care.  They were given PO and IV analgesics for pain control following their surgery.  They were given 24 hours of postoperative antibiotics.   PT was consulted postop to assist with mobility and transfers.  The patient was allowed to be WBAT with therapy and was taught back precautions. Discharge planning was consulted to help with postop disposition and equipment needs.  Patient had a fair night on the evening of surgery and started to get up OOB with therapy on day one. Patient was seen in rounds and was ready to go home on day one.  They were given discharge instructions and dressing directions.  They were instructed on when to follow up in the office with Dr. Tonita Cong.   Diet: Regular diet Activity:WBAT; Lspine precautions Follow-up:in 10-14 days Disposition - Home Discharged Condition: good       Discharge Instructions   Call MD / Call 911    Complete by:  As directed   If you experience chest pain or shortness of breath, CALL 911 and be transported to the hospital emergency room.  If you develope a fever above 101 F, pus (white drainage) or increased drainage or redness at the wound, or calf pain, call your surgeon's office.     Constipation Prevention    Complete by:  As directed   Drink plenty of fluids.  Prune juice may be helpful.  You may use a stool softener, such as Colace (over the counter) 100 mg twice a day.  Use MiraLax (over the counter) for constipation as needed.     Diet - low sodium heart healthy    Complete by:  As directed      Increase activity slowly as tolerated    Complete by:  As directed  Medication List         aspirin-acetaminophen-caffeine [phone] MG per tablet  Commonly known as:   EXCEDRIN MIGRAINE  Take 2 tablets by mouth once as needed for headache or migraine.     docusate sodium 100 MG capsule  Commonly known as:  COLACE  Take 1 capsule (100 mg total) by mouth 2 (two) times daily as needed for mild constipation.     esomeprazole 20 MG capsule  Commonly known as:  NEXIUM  Take 20 mg by mouth daily at 12 noon.     ibuprofen 200 MG tablet  Commonly known as:  ADVIL,MOTRIN  Take 3-4 tablets (600-800 mg total) by mouth once as needed for headache or mild pain.     methocarbamol 500 MG tablet  Commonly known as:  ROBAXIN  Take 1 tablet (500 mg total) by mouth 3 (three) times daily between meals as needed for muscle spasms.     omeprazole 20 MG tablet  Commonly known as:  PRILOSEC OTC  Take 20 mg by mouth daily as needed (for acid reflex).     oxyCODONE-acetaminophen 5-325 MG per tablet  Commonly known as:  PERCOCET  Take 1 tablet by mouth every 4 (four) hours as needed.       Follow-up Information   Follow up with Sanjana Folz C, MD In 2 weeks. (For suture removal)    Specialty:  Orthopedic Surgery   Contact information:   [address] [place] [address] [phone]       Signed: Lacie Draft, PA-C Orthopaedic Surgery 03/18/2014, 12:10 PM

## 2014-03-21 NOTE — ED Provider Notes (Signed)
Medical screening examination/treatment/procedure(s) were performed by non-physician practitioner and as supervising physician I was immediately available for consultation/collaboration.   EKG Interpretation   Date/Time:  Friday March 14 2014 06:27:45 EDT Ventricular Rate:  91 PR Interval:  134 QRS Duration: 82 QT Interval:  345 QTC Calculation: 424 R Axis:   46 Text Interpretation:  Sinus rhythm ED PHYSICIAN INTERPRETATION AVAILABLE  IN [facility] Confirmed by TEST, Record ([id number]) on 03/16/2014 8:58:54  AM        Rolland PorterMark Mikey Maffett, MD 03/21/14 [phone]

## 2015-09-27 IMAGING — CR DG LUMBAR SPINE 2-3V
2 series · 2 of 2 positions shown · non-contrast
Comparison: None.

CLINICAL DATA: Preoperative for lumbar decompression. Disc
protrusion at the L4-5 level.

EXAM:
LUMBAR SPINE - 2-3 VIEW

[w l-spine a.p.]
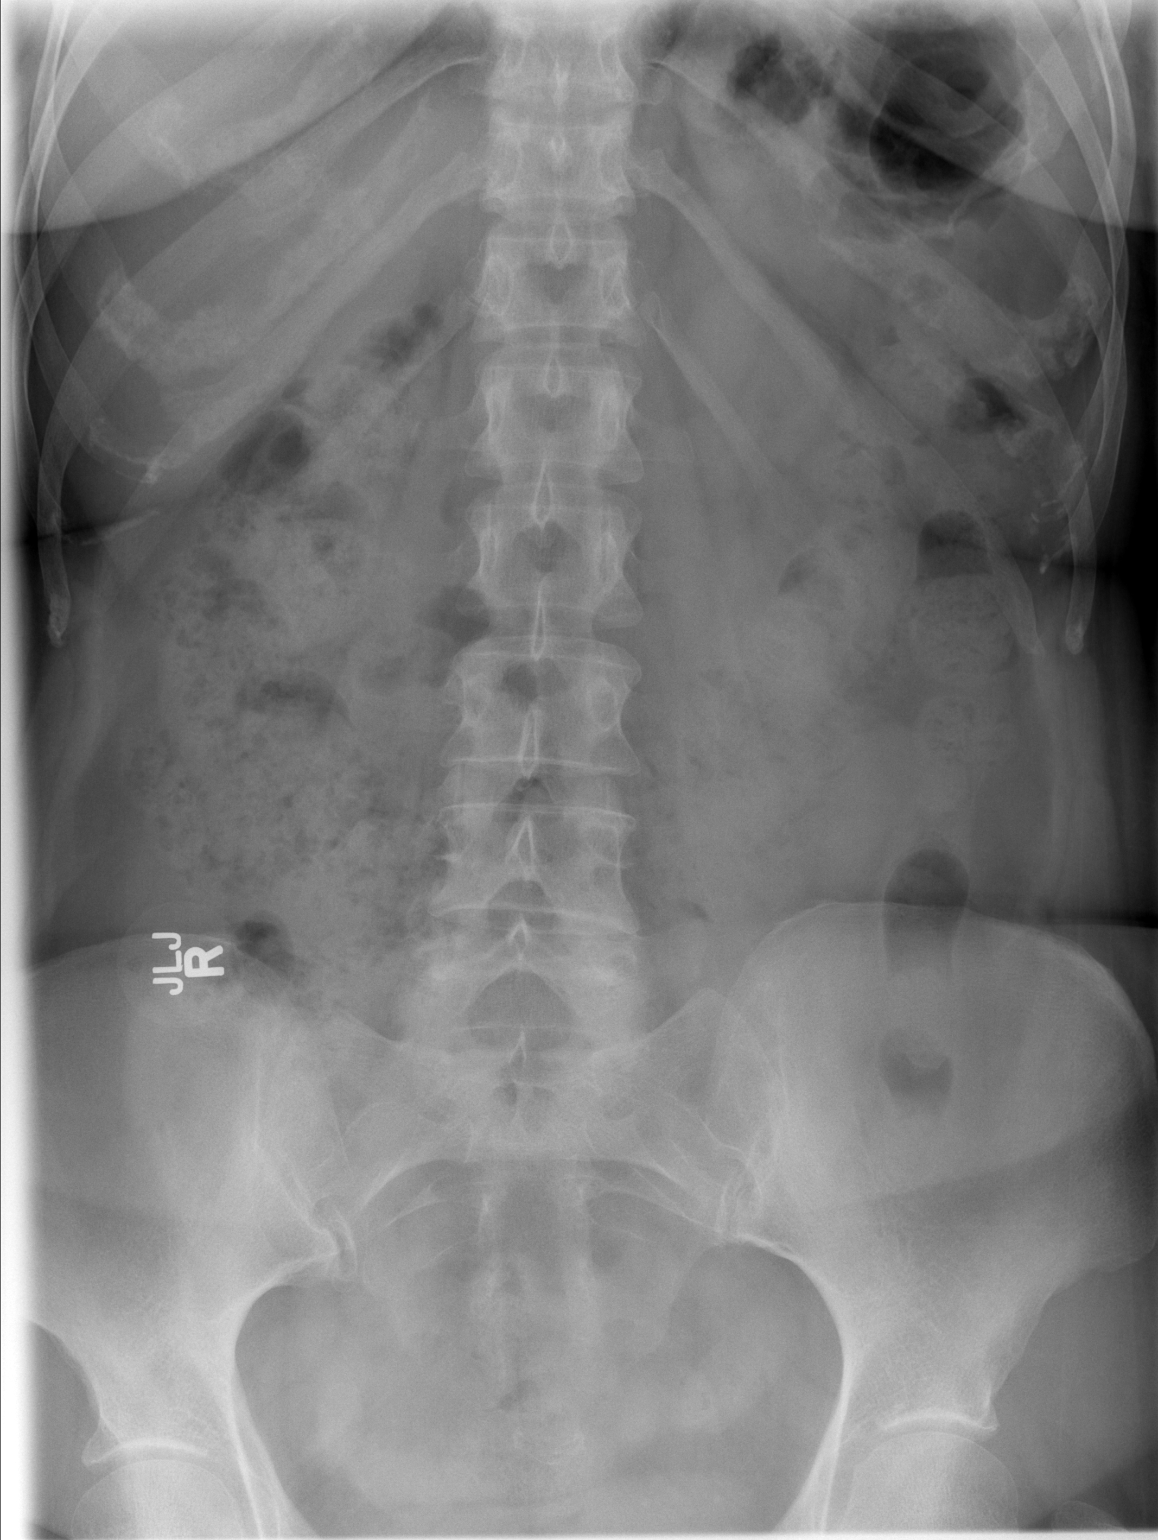

[w l-spine lat]
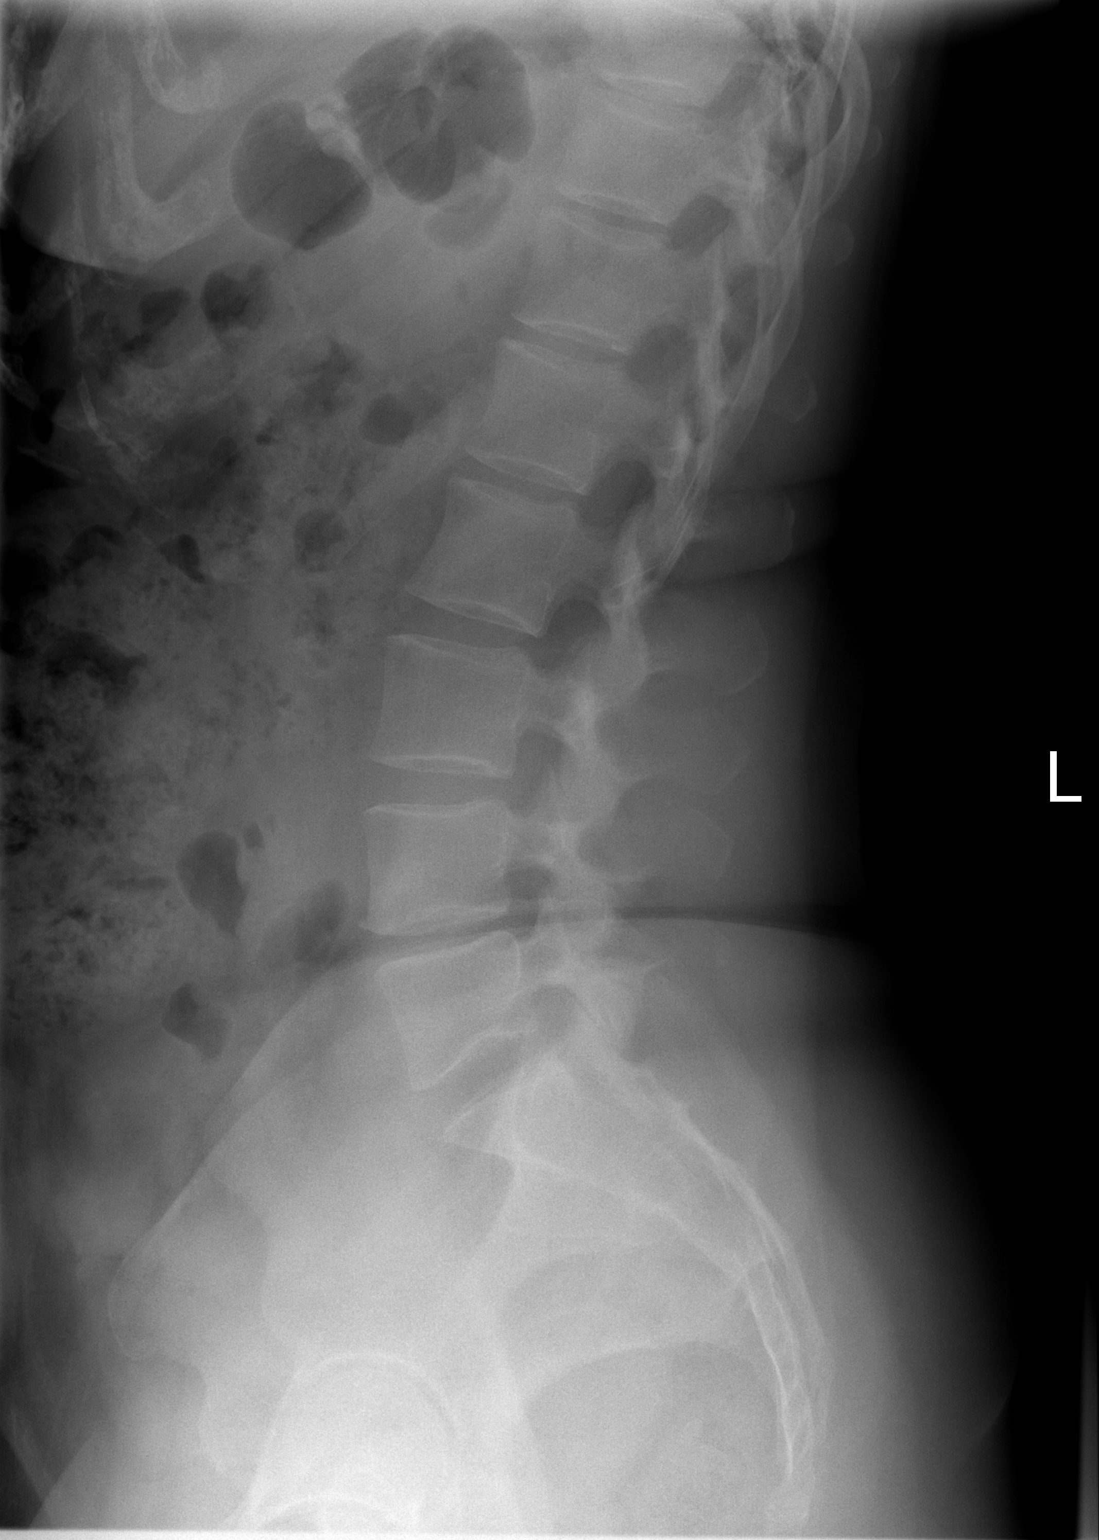

[2 of 2 positions shown; findings below may reference images not displayed]

FINDINGS: There are 5 lumbar type non-rib-bearing vertebra labeled L1 through
L5.

Mild loss of intervertebral disc height at the L4-5 level. There is
potentially mild facet arthropathy at L4-5 and L5-S1. No
malalignment or fracture.
IMPRESSION: 1. Mild loss of intervertebral disc height at the L4-5 level.
Minimal lower lumbar facet arthropathy.

## 2015-10-02 IMAGING — DX DG SPINE 1V PORT
1 series · 1 of 1 positions shown · non-contrast
Comparison: Portable cross-table lateral intraoperative view
compared to preoperative images of 03/07/2014.

CLINICAL DATA: Lumbar laminectomy and decompression microdiscectomy
at L4-L5, lumbar back pain

EXAM:
PORTABLE SPINE - 1 VIEW

[l-spine x-table]
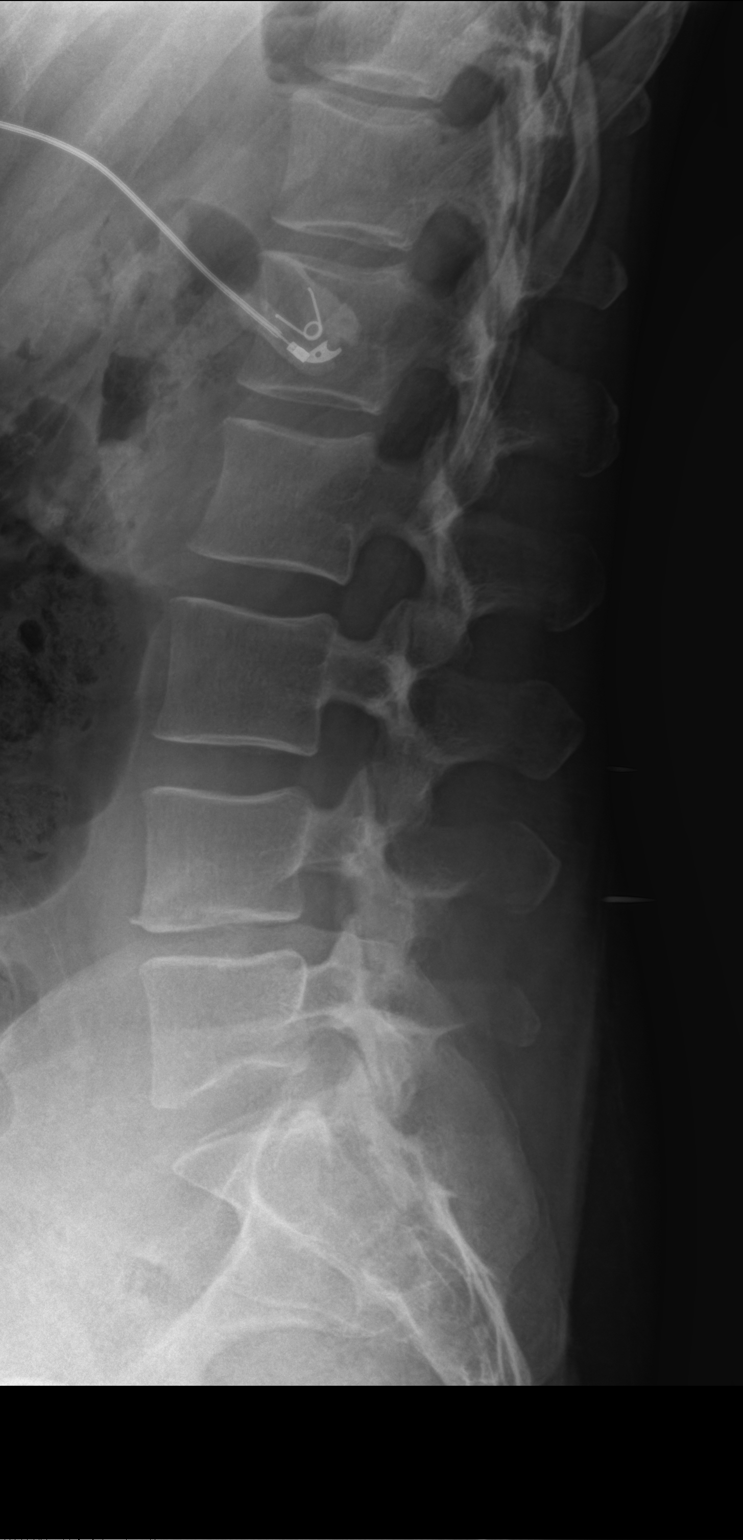

[1 of 1 positions shown; findings below may reference images not displayed]

FINDINGS: Five lumbar vertebrae labeled on prior exam.

Metallic probes via dorsal approach project dorsal to the inferior
margins of the spinous processes of L3 and L4.

Disc space narrowing L4-L5 with tiny inferior L4 endplate spur.
IMPRESSION: Dorsal localization of the inferior aspects of the L3 and L4 spinous
processes.

## 2015-10-02 IMAGING — DX DG SPINE 1V PORT
1 series · 1 of 1 positions shown · non-contrast
Comparison: Preoperative lumbar spine films of 03/07/2014

CLINICAL DATA: Lumbar laminectomy

EXAM:
PORTABLE SPINE - 1 VIEW

[l-spine x-table]
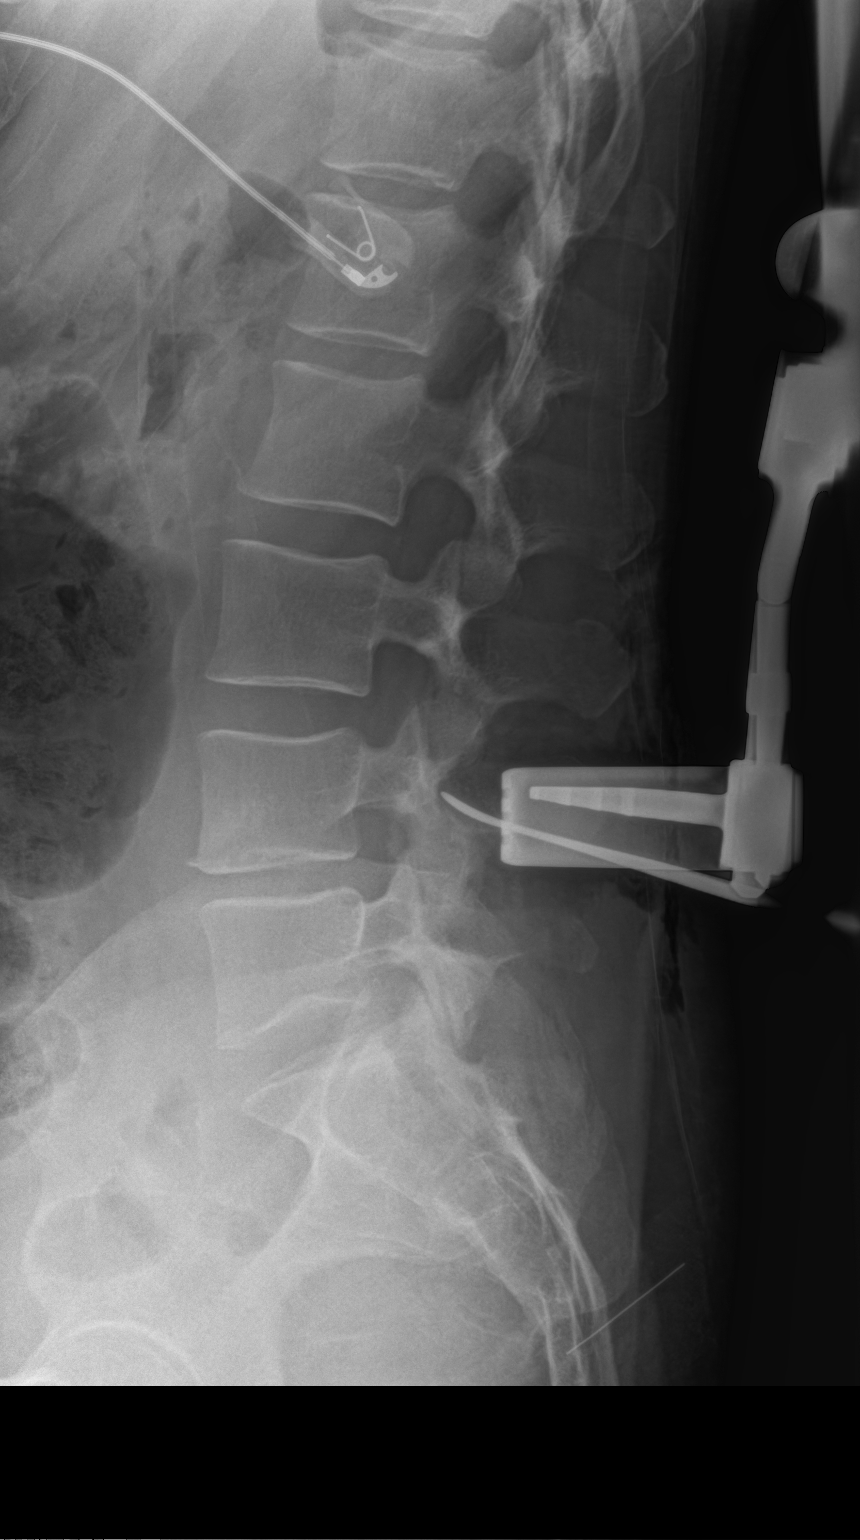

[1 of 1 positions shown; findings below may reference images not displayed]

FINDINGS: An instrument for localization courses beneath the spinous process
of L4, directed toward the posterior elements and midbody of L4.
IMPRESSION: Instrument for localization is just beneath the spinous process of
L4.

## 2015-10-02 IMAGING — DX DG SPINE 1V PORT
1 series · 1 of 1 positions shown · non-contrast
Comparison: Preoperative lumbar spine films of 03/07/2014

CLINICAL DATA: Lumbar laminectomy

EXAM:
PORTABLE SPINE - 1 VIEW

[l-spine x-table]
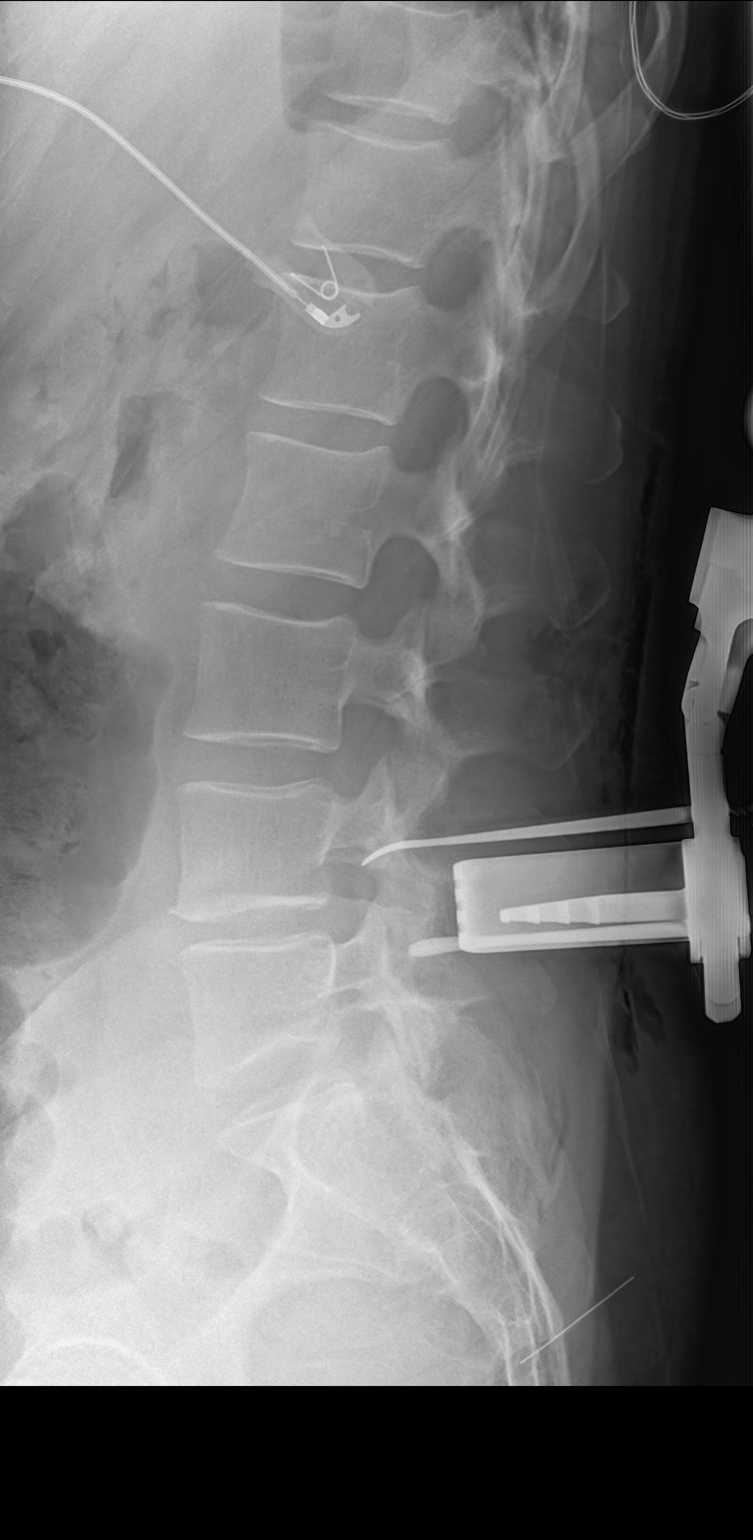

[1 of 1 positions shown; findings below may reference images not displayed]

FINDINGS: A cross-table lateral view from the operating room labeled #3 was
returned.

A clamp is present on the spinous process of L4. The instrument
localization is located just above this clamp, directed toward the
mid body of L4. More caudally there is ad instrument just beneath
the spinous process of L4 directed toward the L4-5 interspace.
IMPRESSION: Clamp on spinous process of L4 with the more cephalad instrument
directed toward the mid body of L4.
